# Patient Record
Sex: Male | Born: 1970 | Race: White | Hispanic: No | Smoking: Never smoker
Health system: Southern US, Community
[De-identification: ages and names within clinical notes are randomized; demographics above are authoritative.]

## PROBLEM LIST (undated history)

## (undated) DIAGNOSIS — I2510 Atherosclerotic heart disease of native coronary artery without angina pectoris: Secondary | ICD-10-CM

## (undated) DIAGNOSIS — I10 Essential (primary) hypertension: Secondary | ICD-10-CM

## (undated) DIAGNOSIS — I219 Acute myocardial infarction, unspecified: Secondary | ICD-10-CM

## (undated) DIAGNOSIS — R51 Headache: Secondary | ICD-10-CM

## (undated) DIAGNOSIS — G4730 Sleep apnea, unspecified: Secondary | ICD-10-CM

## (undated) HISTORY — DX: Atherosclerotic heart disease of native coronary artery without angina pectoris: I25.10

## (undated) HISTORY — PX: CORONARY STENT PLACEMENT: SHX1402

## (undated) HISTORY — DX: Essential (primary) hypertension: I10

---

## 2009-04-15 ENCOUNTER — Encounter: Admission: RE | Admit: 2009-04-15 | Discharge: 2009-04-15 | Payer: Self-pay | Admitting: Internal Medicine

## 2009-05-27 ENCOUNTER — Encounter: Admission: RE | Admit: 2009-05-27 | Discharge: 2009-05-27 | Payer: Self-pay | Admitting: Neurosurgery

## 2009-06-01 ENCOUNTER — Encounter: Admission: RE | Admit: 2009-06-01 | Discharge: 2009-06-01 | Payer: Self-pay | Admitting: Neurosurgery

## 2012-03-09 ENCOUNTER — Inpatient Hospital Stay (HOSPITAL_COMMUNITY)
Admission: EM | Admit: 2012-03-09 | Discharge: 2012-03-11 | DRG: 853 | Disposition: A | Discharge status: Home / Self Care | Payer: BC Managed Care – PPO | Attending: Cardiology | Admitting: Cardiology

## 2012-03-09 ENCOUNTER — Inpatient Hospital Stay (HOSPITAL_COMMUNITY): Admission: AD | Admit: 2012-03-09 | Payer: BC Managed Care – PPO | Source: Ambulatory Visit | Admitting: Cardiology

## 2012-03-09 ENCOUNTER — Emergency Department (HOSPITAL_COMMUNITY): Payer: BC Managed Care – PPO

## 2012-03-09 ENCOUNTER — Encounter (HOSPITAL_COMMUNITY)
Admission: EM | Disposition: A | Discharge status: Home / Self Care | Payer: Self-pay | Source: Home / Self Care | Attending: Cardiology

## 2012-03-09 ENCOUNTER — Encounter (HOSPITAL_COMMUNITY): Payer: Self-pay | Admitting: Emergency Medicine

## 2012-03-09 DIAGNOSIS — R079 Chest pain, unspecified: Secondary | ICD-10-CM

## 2012-03-09 DIAGNOSIS — I2582 Chronic total occlusion of coronary artery: Secondary | ICD-10-CM | POA: Diagnosis present

## 2012-03-09 DIAGNOSIS — G4730 Sleep apnea, unspecified: Secondary | ICD-10-CM | POA: Insufficient documentation

## 2012-03-09 DIAGNOSIS — E782 Mixed hyperlipidemia: Secondary | ICD-10-CM | POA: Diagnosis present

## 2012-03-09 DIAGNOSIS — Z955 Presence of coronary angioplasty implant and graft: Secondary | ICD-10-CM

## 2012-03-09 DIAGNOSIS — I2510 Atherosclerotic heart disease of native coronary artery without angina pectoris: Secondary | ICD-10-CM | POA: Diagnosis present

## 2012-03-09 DIAGNOSIS — I214 Non-ST elevation (NSTEMI) myocardial infarction: Principal | ICD-10-CM

## 2012-03-09 DIAGNOSIS — Z79899 Other long term (current) drug therapy: Secondary | ICD-10-CM

## 2012-03-09 DIAGNOSIS — Z7982 Long term (current) use of aspirin: Secondary | ICD-10-CM

## 2012-03-09 DIAGNOSIS — Z9861 Coronary angioplasty status: Secondary | ICD-10-CM

## 2012-03-09 DIAGNOSIS — I219 Acute myocardial infarction, unspecified: Secondary | ICD-10-CM

## 2012-03-09 HISTORY — DX: Sleep apnea, unspecified: G47.30

## 2012-03-09 HISTORY — DX: Headache: R51

## 2012-03-09 HISTORY — PX: LEFT HEART CATH: SHX5478

## 2012-03-09 HISTORY — PX: PERCUTANEOUS STENT INTERVENTION: SHX5500

## 2012-03-09 LAB — D-DIMER, QUANTITATIVE: D-Dimer, Quant: <0.27 ug/mL-FEU (ref 0.00–0.48)

## 2012-03-09 LAB — COMPREHENSIVE METABOLIC PANEL
ALT: 36 U/L (ref 0–53)
BUN: 18 mg/dL (ref 6–23)
CO2: 26 mEq/L (ref 19–32)
Calcium: 9.4 mg/dL (ref 8.4–10.5)
GFR calc Af Amer: >90 mL/min (ref >90)
GFR calc non Af Amer: >90 mL/min (ref >90)
Glucose, Bld: 119 mg/dL — ABNORMAL HIGH (ref 70–99)
Sodium: 138 mEq/L (ref 135–145)
Total Protein: 7.4 g/dL (ref 6.0–8.3)

## 2012-03-09 LAB — CBC WITH DIFFERENTIAL/PLATELET
Basophils Absolute: 0 10*3/uL (ref 0.0–0.1)
Basophils Relative: 0 % (ref 0–1)
MCHC: 34.7 g/dL (ref 30.0–36.0)
Neutro Abs: 3.9 10*3/uL (ref 1.7–7.7)
Neutrophils Relative %: 51 % (ref 43–77)
Platelets: 212 10*3/uL (ref 150–400)
RDW: 13.2 % (ref 11.5–15.5)

## 2012-03-09 LAB — CK TOTAL AND CKMB (NOT AT ARMC)
CK, MB: 16 ng/mL (ref 0.3–4.0)
Relative Index: 5.2 — ABNORMAL HIGH (ref 0.0–2.5)

## 2012-03-09 LAB — LIPID PANEL
HDL: 36 mg/dL — ABNORMAL LOW (ref >39)
Total CHOL/HDL Ratio: 4.8 RATIO
Triglycerides: 114 mg/dL (ref <150)

## 2012-03-09 LAB — TROPONIN I: Troponin I: >20 ng/mL (ref <0.30)

## 2012-03-09 LAB — HEMOGLOBIN A1C: Mean Plasma Glucose: 103 mg/dL (ref <117)

## 2012-03-09 LAB — POCT I-STAT TROPONIN I: Troponin i, poc: 0.05 ng/mL (ref 0.00–0.08)

## 2012-03-09 SURGERY — LEFT HEART CATH
Anesthesia: LOCAL

## 2012-03-09 MED ORDER — ASPIRIN 300 MG RE SUPP
300.0000 mg | RECTAL | Status: DC
  Filled 2012-03-09: qty 1

## 2012-03-09 MED ORDER — VERAPAMIL HCL 2.5 MG/ML IV SOLN
INTRAVENOUS | Status: AC
  Filled 2012-03-09: qty 2

## 2012-03-09 MED ORDER — SODIUM CHLORIDE 0.9 % IV BOLUS (SEPSIS)
1000.0000 mL | Freq: Once | INTRAVENOUS | Status: AC
  Administered 2012-03-09: 1000 mL via INTRAVENOUS

## 2012-03-09 MED ORDER — SODIUM CHLORIDE 0.9 % IV SOLN: 250.0000 mL | INTRAVENOUS | Status: DC | PRN

## 2012-03-09 MED ORDER — ONDANSETRON HCL 4 MG/2ML IJ SOLN
INTRAMUSCULAR | Status: AC
  Filled 2012-03-09: qty 2

## 2012-03-09 MED ORDER — LIDOCAINE HCL (PF) 1 % IJ SOLN
INTRAMUSCULAR | Status: AC
  Filled 2012-03-09: qty 30

## 2012-03-09 MED ORDER — SODIUM CHLORIDE 0.9 % IV SOLN: INTRAVENOUS | Status: DC

## 2012-03-09 MED ORDER — PRASUGREL HCL 10 MG PO TABS
10.0000 mg | ORAL_TABLET | Freq: Every day | ORAL | Status: DC
  Administered 2012-03-09 – 2012-03-11 (×3): 10 mg via ORAL
  Filled 2012-03-09 (×3): qty 1

## 2012-03-09 MED ORDER — ONDANSETRON HCL 4 MG/2ML IJ SOLN
4.0000 mg | Freq: Once | INTRAMUSCULAR | Status: AC
  Administered 2012-03-09: 4 mg via INTRAVENOUS

## 2012-03-09 MED ORDER — ONDANSETRON HCL 4 MG/2ML IJ SOLN: 4.0000 mg | Freq: Four times a day (QID) | INTRAMUSCULAR | Status: DC | PRN

## 2012-03-09 MED ORDER — SODIUM CHLORIDE 0.9 % IV SOLN
INTRAVENOUS | Status: AC
  Administered 2012-03-09: 150 mL via INTRAVENOUS

## 2012-03-09 MED ORDER — NITROGLYCERIN 0.4 MG SL SUBL: 0.4000 mg | SUBLINGUAL_TABLET | SUBLINGUAL | Status: DC | PRN

## 2012-03-09 MED ORDER — NITROGLYCERIN 0.2 MG/ML ON CALL CATH LAB
INTRAVENOUS | Status: AC
  Filled 2012-03-09: qty 1

## 2012-03-09 MED ORDER — MIDAZOLAM HCL 2 MG/2ML IJ SOLN
INTRAMUSCULAR | Status: AC
  Filled 2012-03-09: qty 2

## 2012-03-09 MED ORDER — HYDROMORPHONE HCL PF 2 MG/ML IJ SOLN
INTRAMUSCULAR | Status: AC
  Filled 2012-03-09: qty 1

## 2012-03-09 MED ORDER — ENOXAPARIN SODIUM 60 MG/0.6ML ~~LOC~~ SOLN
90.0000 mg | Freq: Once | SUBCUTANEOUS | Status: AC
  Administered 2012-03-09: 90 mg via SUBCUTANEOUS
  Filled 2012-03-09: qty 0.3

## 2012-03-09 MED ORDER — MORPHINE SULFATE 4 MG/ML IJ SOLN
4.0000 mg | Freq: Once | INTRAMUSCULAR | Status: AC
  Administered 2012-03-09: 4 mg via INTRAVENOUS
  Filled 2012-03-09: qty 1

## 2012-03-09 MED ORDER — DIAZEPAM 5 MG PO TABS: 5.0000 mg | ORAL_TABLET | ORAL | Status: DC

## 2012-03-09 MED ORDER — ATORVASTATIN CALCIUM 80 MG PO TABS: 80.0000 mg | ORAL_TABLET | Freq: Every day | ORAL | Status: DC

## 2012-03-09 MED ORDER — ATORVASTATIN CALCIUM 80 MG PO TABS
80.0000 mg | ORAL_TABLET | Freq: Every day | ORAL | Status: DC
  Filled 2012-03-09: qty 1

## 2012-03-09 MED ORDER — BIVALIRUDIN 250 MG IV SOLR
INTRAVENOUS | Status: AC
  Filled 2012-03-09: qty 250

## 2012-03-09 MED ORDER — SODIUM CHLORIDE 0.9 % IJ SOLN: 3.0000 mL | INTRAMUSCULAR | Status: DC | PRN

## 2012-03-09 MED ORDER — ASPIRIN 81 MG PO CHEW
81.0000 mg | CHEWABLE_TABLET | Freq: Every day | ORAL | Status: DC
  Administered 2012-03-10 – 2012-03-11 (×2): 81 mg via ORAL
  Filled 2012-03-09 (×2): qty 1

## 2012-03-09 MED ORDER — HEPARIN (PORCINE) IN NACL 2-0.9 UNIT/ML-% IJ SOLN
INTRAMUSCULAR | Status: AC
  Filled 2012-03-09: qty 1500

## 2012-03-09 MED ORDER — CARVEDILOL 3.125 MG PO TABS
3.1250 mg | ORAL_TABLET | Freq: Two times a day (BID) | ORAL | Status: DC
  Administered 2012-03-09 – 2012-03-11 (×4): 3.125 mg via ORAL
  Filled 2012-03-09 (×6): qty 1

## 2012-03-09 MED ORDER — ACETAMINOPHEN 325 MG PO TABS: 650.0000 mg | ORAL_TABLET | ORAL | Status: DC | PRN

## 2012-03-09 MED ORDER — ACETAMINOPHEN 325 MG PO TABS
650.0000 mg | ORAL_TABLET | ORAL | Status: DC | PRN
  Administered 2012-03-09 – 2012-03-10 (×2): 650 mg via ORAL
  Filled 2012-03-09 (×2): qty 2

## 2012-03-09 MED ORDER — GI COCKTAIL ~~LOC~~
30.0000 mL | Freq: Once | ORAL | Status: AC
  Administered 2012-03-09: 30 mL via ORAL
  Filled 2012-03-09: qty 30

## 2012-03-09 MED ORDER — PRASUGREL HCL 10 MG PO TABS
ORAL_TABLET | ORAL | Status: AC
  Filled 2012-03-09: qty 6

## 2012-03-09 MED ORDER — NITROGLYCERIN 0.4 MG SL SUBL
0.4000 mg | SUBLINGUAL_TABLET | SUBLINGUAL | Status: DC | PRN
  Administered 2012-03-09: 0.4 mg via SUBLINGUAL
  Filled 2012-03-09: qty 25

## 2012-03-09 NOTE — ED Notes (Signed)
Patient complaining of mid-sternal chest pain that started upon waking this morning.  Rates pain 7/10; mid-sternal.  Denies radiation of pain and associated cardiac symptoms.  Reports excessive burping; denies cardiac history.

## 2012-03-09 NOTE — CV Procedure (Signed)
Procedure performed:  Left heart catheterization including hemodynamic monitoring of the left ventricle, LV gram. Selective right and left coronary arteriography. Thrombectomy with use of Pronto V4 aspiration catheter. PTCA and stenting of the proximal ramus intermediate with implantation of a 3.0 x 20 mm Promos Premier eluting stent deployed at 11 atmospheric pressure. Stenosis reduced from 100% to 0%..  Indication: Patient is a  41 year-old  Caucasian  male with history of    Mixed hyperlipidemia, but a lipid profile testing done a year ago, has lost about 20 pounds in weight since I last saw him a year ago, presented to the emergency department with chest pain and his cardiac markers were positive for myocardial injury. Due to 2 episodes of ventricle standstill lasting for seconds, positive cardiac markers urgent cardiac catheterization was felt to be indicated. Hence is brought to the cardiac catheterization lab to evaluate  coronary anatomy for definitive diagnosis of CAD.  Hemodynamic data: Left ventricular pressure was 115/11 with LVEDP of 19 mm mercury. Aortic pressure was 117/75 with a mean of 96 mm mercury. There was no pressure gradient across the aortic valve.   Left ventricle: Performed in the RAO projection revealed LVEF of 55-60%.  there was mild anterolateral hypokinesis in the LAO view. There was  No  MR.   Right coronary artery: The vessel  large with mild diffuse luminal irregularity in the mid to distal segment followed by is to 70-80% hazy stenosis. It is codominant with circumflex coronary artery. The PDA is a very large branch. There is several by mouth small branches.  Left main coronary artery is large and normal.  Ramus intermediate: Large vessel which is occluded in the proximal segment. Distally it fills faintly. There was appearance of thrombus burden on the initial angiogram with evidence of staining with the dye.  Circumflex coronary artery: A large vessel giving  origin to a large obtuse marginal 1. It is codominant with right coronary artery. Gives origin to a small PDA.  LAD:  LAD gives origin to a large diagonal-1.  LAD has  minimal luminal irregularities.   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 Left systolic function is preserved with mild anterolateral hypokinesis. Has a 70-80% hazy distal RCA stenosis which also appears to be significant however probably not the culprit for the presentation at this time.   Interventional data: Successful  thrombectomy with use of Pronto V4 aspiration catheter followed by PTCA and stenting of the proximal ramus intermediate with implantation of a 3.0 x 20 mm Promos Premier eluting stent. Stenosis was reduced from 100% TIMI 0 flow to 0% with TIMI 3 flow.   Recommendation: Will need Dual antiplatelet therapy with Effient and ASA 81 mg for at least 1 year. I will consider bringing him back to relook right coronary artery and possibly consider either angioplasty or FFR. If he remains stable I may also consider outpatient stress testing. He'll need aggressive risk factor modification.    Technique of diagnostic cardiac catheterization:  Under sterile precautions using a 6 French right radial  arterial access, a 6 French sheath was introduced into the right radial artery. A 5 [place] Tig 4 catheter was advanced into the ascending aorta selective  right coronary artery and left coronary artery was cannulated and angiography was performed in multiple views. The catheter was pulled back Out of the body over exchange length J-wire.   same Catheter was used to perform LV gram which  was performed in LAO/RAO  projection. Catheter exchanged out of the body over J-Wire. NO immediate complications noted. Hemostasis achieved with TR band. Patient tolerated the procedure well.   Technique of intervention:  Using a 6 [place] XB 3.5 guide catheter the LM  coronary  was  selected and cannulated. Using Angiomax for anticoagulation, I utilized a BMW guidewire and across the Ramus intermediate coronary artery without any difficulty. I placed the tip of the wire into the distal  coronary artery. Angiography was performed.   Then I utilized a Pronto V4 aspiration catheter and one pass was made with aspiration of significant amount of white thrombus.  I proceeded with implantation of a  3.0 x 20  mm promos Premier   drug-eluting stent into the  ramus intermediate  coronary artery. The stent was deployed at  11  atmospheric pressure for  40   seconds.  Post angioplasty results were excellent with 0% residual stenoses and TIMI-3 flow was maintained. There was no evidence of edge dissection. The guidewire was withdrawn out of the body and the guide catheter was engaged and pulled out of the body over the J-wire the was no immediate complication. Patient tolerated the procedure well.  Disposition: Patient will be discharged in  24-48 hours  unless complications with out-patient follow up.

## 2012-03-09 NOTE — ED Provider Notes (Signed)
History     CSN: [id number]  Arrival date & time 03/09/12  0620   First MD Initiated Contact with Patient 03/09/12 0827      Chief Complaint  Patient presents with  . Chest Pain    (Consider location/radiation/quality/duration/timing/severity/associated sxs/prior treatment) Patient is a 41 y.o. male presenting with chest pain. The history is provided by the patient. No language interpreter was used.  Chest Pain The chest pain began 3 - 5 hours ago. Chest pain occurs constantly. The chest pain is unchanged. At its most intense, the pain is at 8/10. The pain is currently at 3/10. The severity of the pain is moderate. The quality of the pain is described as dull and sharp. The pain radiates to the upper back. Primary symptoms include nausea. Pertinent negatives for primary symptoms include no fever, no fatigue, no syncope, no shortness of breath, no cough, no wheezing, no palpitations, no vomiting and no dizziness.  Associated symptoms include diaphoresis.  Pertinent negatives for associated symptoms include no lower extremity edema, no near-syncope and no weakness.    41 year old male who awoke at 5 AM and was going to the bathroom and noticed he had some midsternal sharp chest pain with burping that radiated to his back and 8/10. The pain soon got better and it was a dull 3/10. Patient had no shortness of breath no vomiting. He did become diaphoretic and dizzy at that time which has resolved now. He took aspirin at home. Had a negative stress test one year ago. He does not have high cholesterol or family history of heart disease and he does not smoke. He does travel frequently. Average 10 a week flying. Denies shortness of breath, or calf pain.   Patient states that he had fried chicken last night and while. Her dinner. States the pain is still constant 3/10 but it is dull and it does not radiate to his back this time. Patient's primary care provider is Dr. Darnelle Catalan. Patient has seen Dr. Jacinto Halim  in  the past  with [facility] medical  Past Medical History  Diagnosis Date  . Sleep apnea     History reviewed. No pertinent past surgical history.  History reviewed. No pertinent family history.  History  Substance Use Topics  . Smoking status: Never Smoker   . Smokeless tobacco: Not on file  . Alcohol Use: Yes      Review of Systems  Constitutional: Positive for diaphoresis. Negative for fever and fatigue.  HENT: Negative.   Eyes: Negative.   Respiratory: Negative.  Negative for cough, shortness of breath and wheezing.   Cardiovascular: Positive for chest pain. Negative for palpitations, syncope and near-syncope.  Gastrointestinal: Positive for nausea. Negative for vomiting.  Neurological: Negative.  Negative for dizziness and weakness.  Psychiatric/Behavioral: Negative.   All other systems reviewed and are negative.    Allergies  Review of patient's allergies indicates no known allergies.  Home Medications   Current Outpatient Rx  Name  Route  Sig  Dispense  Refill  . ASPIRIN 325 MG PO TABS   Oral   Take 325 mg by mouth once.           BP 124/84  Pulse 59  Temp 98.1 F (36.7 C) (Oral)  Resp 20  SpO2 94%  Physical Exam  Nursing note and vitals reviewed. Constitutional: He is oriented to person, place, and time. He appears well-developed and well-nourished.  HENT:  Head: Normocephalic.  Eyes: Conjunctivae normal and EOM are normal. Pupils are  equal, round, and reactive to light.  Neck: Normal range of motion. Neck supple.  Cardiovascular: Normal rate.   Pulmonary/Chest: Effort normal and breath sounds normal. No respiratory distress.  Abdominal: Soft. Bowel sounds are normal. He exhibits no distension.  Musculoskeletal: Normal range of motion.  Neurological: He is alert and oriented to person, place, and time.  Skin: Skin is warm and dry.  Psychiatric: He has a normal mood and affect.    ED Course  Procedures (including critical care time)  Labs  Reviewed  COMPREHENSIVE METABOLIC PANEL - Abnormal; Notable for the following:    Glucose, Bld 119 (*)     All other components within normal limits  CBC WITH DIFFERENTIAL  POCT I-STAT TROPONIN I   Dg Chest 2 View  03/09/2012  *RADIOLOGY REPORT*  Clinical Data: Chest pain.  Nausea, syncope.  CHEST - 2 VIEW  Comparison: 10/28/2010  Findings: Heart and mediastinal contours are within normal limits. No focal opacities or effusions.  No acute bony abnormality.  IMPRESSION: No active cardiopulmonary disease.   Original Report Authenticated By: Charlett Nose, M.D.      No diagnosis found.    MDM  10am  Patient will be admitted for Chest pain per Dr. Nadara Eaton who will see him in the ER.  Second troponin shows and elevation.  D dimer negative. CP 3/10 dull ache.  Will give morphine to see if pain goes away.  No change in pain with ntg.  Some better after GI cocktail.  Chest x-ray unremarkable reviewed by myself.  Shared visit with Dr. Ignacia Palma.     Labs Reviewed  COMPREHENSIVE METABOLIC PANEL - Abnormal; Notable for the following:    Glucose, Bld 119 (*)     All other components within normal limits  POCT I-STAT TROPONIN I - Abnormal; Notable for the following:    Troponin i, poc 0.17 (*)     All other components within normal limits  CK TOTAL AND CKMB - Abnormal; Notable for the following:    Total CK 308 (*)     CK, MB 16.0 (*)     Relative Index 5.2 (*)     All other components within normal limits  TROPONIN I - Abnormal; Notable for the following:    Troponin I 1.08 (*)     All other components within normal limits  CBC WITH DIFFERENTIAL  POCT I-STAT TROPONIN I  D-DIMER, QUANTITATIVE    11:55  Patient was given morphine for his 3/10 chest pressure.  Patient became diaphoretic and pre syncopal with a few seconds of asytole.  Patient 's heart rate then went into the 30's nsr.  Patient denies chest pain at this time but he did become diaphoretic and very pale. Pacer pads on.  O2 2L [place]  applied.  Dr. Nadara Eaton at bedside.  Patient for cath lab now.          Remi Haggard, NP 03/09/12 1210

## 2012-03-09 NOTE — ED Notes (Signed)
Pt to Cath lab for cath. Md at bedside the patient A/O at time of transport

## 2012-03-09 NOTE — ED Provider Notes (Signed)
8:31 AM  Date: 03/09/2012  Rate: 52  Rhythm: sinus bradycardia  QRS Axis: normal  Intervals: normal PQRS:  Left atrial abnormality.  ST/T Wave abnormalities: normal  Conduction Disutrbances:none  Narrative Interpretation: Borderline EKG  Old EKG Reviewed: none available    Carleene Cooper III, MD 03/09/12 [phone]

## 2012-03-09 NOTE — ED Notes (Signed)
Pt states last meal was chicken and waffles at around 2000 last night. States he has had this pain before but not this bad.

## 2012-03-09 NOTE — ED Notes (Signed)
Abnormal labs where given to Dr. Ignacia Palma

## 2012-03-09 NOTE — H&P (Signed)
Justin Keller is an 41 y.o. male.   Chief Complaint: Chest pain HPI: Patient is a 41 year old Caucasian male with no prior history of cardiac disease, no history of hypertension but does have mild hyperlipidemia who had been doing well and I had evaluated him for atypical chest pain in May of 2012 and he has had  routine treadmill exercise stress test on 10/04/2010, in which was able to exercise for 9 minutes without any chest pain in the EKG was negative for ischemia. He had been doing well until yesterday afternoon around 3:00 in the afternoon, started to feel unwell but could not really point his fingers to anything, he had a lot of belching. Last night he had mild chest discomfort and he woke up around 4:00 this morning with mild chest discomfort which again significant amount of belching. However the chest discomfort got worse around 5:30 to 6:00 in the morning and eventually he presented to the emergency room. Due to his presentation, cardiac markers were obtained which were positive for non-ST elevation myocardial infarction. I was called to see the patient. Patient had 3 episodes of near-syncope and probably one episode of syncope after the IV line was started. At home he felt near syncopal at one point. He received IV morphine for chest discomfort, then he had 4 second ventricle standstill. He still continues to have mild chest discomfort. He denies any hemoptysis, denies any leg edema, denies any painful swelling of the legs. He has been traveling recently a lot of his work.  Past Medical History  Diagnosis Date  . Sleep apnea     History reviewed. No pertinent past surgical history.  History reviewed. No pertinent family history. Social History:  reports that he has never smoked. He does not have any smokeless tobacco history on file. He reports that he drinks alcohol. He reports that he does not use illicit drugs.  Allergies: No Known Allergies  Review of Systems - General ROS:  negative for - chills, fatigue, fever, hot flashes, malaise, night sweats, weight gain or weight loss Respiratory ROS: no cough, shortness of breath, or wheezing Cardiovascular ROS: positive for - chest pain negative for - dyspnea on exertion, edema, murmur, orthopnea, palpitations or paroxysmal nocturnal dyspnea Neurological ROS: no TIA or stroke symptoms  Blood pressure 130/76, pulse 63, temperature 98.1 F (36.7 C), temperature source Oral, resp. rate 15, SpO2 98.00%. General appearance: alert, appears stated age and no distress Eyes: conjunctivae/corneas clear. PERRL, EOM's intact. Fundi benign. Neck: no adenopathy, no carotid bruit, no JVD, supple, symmetrical, trachea midline and thyroid not enlarged, symmetric, no tenderness/mass/nodules Neck: JVP - normal, carotids 2+= without bruits Resp: clear to auscultation bilaterally Chest wall: no tenderness Cardio: regular rate and rhythm, S1, S2 normal, no murmur, click, rub or gallop GI: soft, non-tender; bowel sounds normal; no masses,  no organomegaly Extremities: extremities normal, atraumatic, no cyanosis or edema Pulses: 2+ and symmetric Skin: Skin color, texture, turgor normal. No rashes or lesions Neurologic: Grossly normal  Results for orders placed during the hospital encounter of 03/09/12 (from the past 48 hour(s))  CBC WITH DIFFERENTIAL     Status: Normal   Collection Time   03/09/12  6:42 AM      Component Value Range Comment   WBC 7.6  4.0 - 10.5 K/uL    RBC 4.61  4.22 - 5.81 MIL/uL    Hemoglobin 14.9  13.0 - 17.0 g/dL    HCT 16.1  09.6 - 04.5 %  MCV 93.3  78.0 - 100.0 fL    MCH 32.3  26.0 - 34.0 pg    MCHC 34.7  30.0 - 36.0 g/dL    RDW 16.1  09.6 - 04.5 %    Platelets 212  150 - 400 K/uL    Neutrophils Relative 51  43 - 77 %    Neutro Abs 3.9  1.7 - 7.7 K/uL    Lymphocytes Relative 39  12 - 46 %    Lymphs Abs 2.9  0.7 - 4.0 K/uL    Monocytes Relative 8  3 - 12 %    Monocytes Absolute 0.6  0.1 - 1.0 K/uL     Eosinophils Relative 3  0 - 5 %    Eosinophils Absolute 0.2  0.0 - 0.7 K/uL    Basophils Relative 0  0 - 1 %    Basophils Absolute 0.0  0.0 - 0.1 K/uL   COMPREHENSIVE METABOLIC PANEL     Status: Abnormal   Collection Time   03/09/12  6:42 AM      Component Value Range Comment   Sodium 138  135 - 145 mEq/L    Potassium 4.0  3.5 - 5.1 mEq/L    Chloride 102  96 - 112 mEq/L    CO2 26  19 - 32 mEq/L    Glucose, Bld 119 (*) 70 - 99 mg/dL    BUN 18  6 - 23 mg/dL    Creatinine, Ser 4.09  0.50 - 1.35 mg/dL    Calcium 9.4  8.4 - 81.1 mg/dL    Total Protein 7.4  6.0 - 8.3 g/dL    Albumin 3.9  3.5 - 5.2 g/dL    AST 29  0 - 37 U/L    ALT 36  0 - 53 U/L    Alkaline Phosphatase 60  39 - 117 U/L    Total Bilirubin 0.4  0.3 - 1.2 mg/dL    GFR calc non Af Amer >90  >90 mL/min    GFR calc Af Amer >90  >90 mL/min   POCT I-STAT TROPONIN I     Status: Normal   Collection Time   03/09/12  6:47 AM      Component Value Range Comment   Troponin i, poc 0.05  0.00 - 0.08 ng/mL    Comment 3            D-DIMER, QUANTITATIVE     Status: Normal   Collection Time   03/09/12  9:24 AM      Component Value Range Comment   D-Dimer, Quant <0.27  0.00 - 0.48 ug/mL-FEU   POCT I-STAT TROPONIN I     Status: Abnormal   Collection Time   03/09/12  9:47 AM      Component Value Range Comment   Troponin i, poc 0.17 (*) 0.00 - 0.08 ng/mL    Comment NOTIFIED PHYSICIAN      Comment 3            CK TOTAL AND CKMB     Status: Abnormal   Collection Time   03/09/12 10:43 AM      Component Value Range Comment   Total CK 308 (*) 7 - 232 U/L    CK, MB 16.0 (*) 0.3 - 4.0 ng/mL    Relative Index 5.2 (*) 0.0 - 2.5   TROPONIN I     Status: Abnormal   Collection Time   03/09/12 10:43 AM      Component  Value Range Comment   Troponin I 1.08 (*) <0.30 ng/mL    Dg Chest 2 View  03/09/2012  *RADIOLOGY REPORT*  Clinical Data: Chest pain.  Nausea, syncope.  CHEST - 2 VIEW  Comparison: 10/28/2010  Findings: Heart and mediastinal  contours are within normal limits. No focal opacities or effusions.  No acute bony abnormality.  IMPRESSION: No active cardiopulmonary disease.   Original Report Authenticated By: Charlett Nose, M.D.     Labs:   Lab Results  Component Value Date   WBC 7.6 03/09/2012   HGB 14.9 03/09/2012   HCT 43.0 03/09/2012   MCV 93.3 03/09/2012   PLT 212 03/09/2012    Lab 03/09/12 0642  NA 138  K 4.0  CL 102  CO2 26  BUN 18  CREATININE 0.94  CALCIUM 9.4  PROT 7.4  BILITOT 0.4  ALKPHOS 60  ALT 36  AST 29  GLUCOSE 119*   Lab Results  Component Value Date   CKTOTAL 308* 03/09/2012   CKMB 16.0* 03/09/2012   TROPONINI 1.08* 03/09/2012    EKG: 03/09/2012: normal EKG, normal sinus rhythm at a rate of 52 beats a minute, normal intervals, no evidence of ischemia.   Assessment/Plan 1. Non-ST elevation myocardial infarction with EKG revealing no testicular change ischemia but sinus bradycardia otherwise normal. 2. Syncope and ventricular standstill when patient was started on IV line, he had one episode of syncope and was transient but also had another episode of ventricular  standstill and near syncope when he was administered 3 mg of morphine sulfate intravenously. 3. Mixed hyperlipidemia: Labs done on 04/05/2010 had revealed total cholesterol of 293, triglycerides 153, HDL 30, LDL was 122.  Recommendation: I'm concerned about his presentation and increasing cardiac markers for myocardial injury. I suspect he probably may have right coronary artery or a dominant circumflex coronary artery stenosis. I will proceed with cardiac catheterization. Patient understands to less than 1% risk of that, stroke, MI, need for urgent CABG but not limited to these as major complications from cardiac catheterization. The patient's mother is at the bedside. Patient has already received Lovenox and aspirin on presentation. I also perform lipid profile testing as he has not eaten anything since morning. Further conditions to  follow after cardiac catheterization.   Pamella Pert, MD 03/09/2012, 12:15 PM [facility] Pager: [phone] Office: [phone] If no answer: Cell:  [phone]

## 2012-03-09 NOTE — Progress Notes (Signed)
Chaplain responded to page from ED that pt would be going from A4 to cath lab. He is a non-STEMI patient. Visited with pt's mom in hall outside pt room as she waited for cath lab team to be assembled. She said pt is under great stress from his work and also has recently experienced a marital separation. At [facility] request and pt's consent I led prayer at bedside with pt and mom. I had to depart before pt went to cath lab. Later I went to consult room outside cath lab and was present with mom and pt's wife when Dr. Jacinto Halim reported to them. Procedure went very well and pt has been moved to 2900. I took pt's mom and wife to 2900 waiting area where nurse met them and took them to pt's room - 2923.

## 2012-03-09 NOTE — Progress Notes (Signed)
12:04 PM Pt had brief sinus arrest lasting several seconds.  Third TNI was 1.08, going up.  Dr. Jacinto Halim taking pt to cath lab.  CRITICAL CARE Performed by: Osvaldo Human   Total critical care time: 30 Critical care time was exclusive of separately billable procedures and treating other patients.  Critical care was necessary to treat or prevent imminent or life-threatening deterioration.  Critical care was time spent personally by me on the following activities: development of treatment plan with patient and/or surrogate as well as nursing, discussions with consultants, evaluation of patient's response to treatment, examination of patient, obtaining history from patient or surrogate, ordering and performing treatments and interventions, ordering and review of laboratory studies, ordering and review of radiographic studies, pulse oximetry and  re-evaluation of patient's condition.

## 2012-03-09 NOTE — Progress Notes (Signed)
41 yo man with chest pain today.  EKG is non-acute, but second TNI is high at 0.17.  Call to Dr. Yates Decamp, cardiologist whom he has seen before --> 10:46 AM Spoke to Dr. Jacinto Halim, who requested a third set of cardiac markers, an injection of Lovenox, and he will be in to see pt.

## 2012-03-09 NOTE — ED Notes (Signed)
Thurston Hole, NP at the bedside.

## 2012-03-10 LAB — BASIC METABOLIC PANEL
Chloride: 105 mEq/L (ref 96–112)
Creatinine, Ser: 0.89 mg/dL (ref 0.50–1.35)
GFR calc Af Amer: >90 mL/min (ref >90)
GFR calc non Af Amer: >90 mL/min (ref >90)
Potassium: 3.6 mEq/L (ref 3.5–5.1)

## 2012-03-10 LAB — CK TOTAL AND CKMB (NOT AT ARMC): Total CK: 1221 U/L — ABNORMAL HIGH (ref 7–232)

## 2012-03-10 LAB — MRSA PCR SCREENING: MRSA by PCR: NEGATIVE

## 2012-03-10 LAB — TSH: TSH: 0.884 u[IU]/mL (ref 0.350–4.500)

## 2012-03-10 LAB — CBC
MCHC: 34.2 g/dL (ref 30.0–36.0)
MCV: 94.6 fL (ref 78.0–100.0)
Platelets: 182 10*3/uL (ref 150–400)
RDW: 13.7 % (ref 11.5–15.5)
WBC: 8.7 10*3/uL (ref 4.0–10.5)

## 2012-03-10 LAB — TROPONIN I: Troponin I: >20 ng/mL (ref <0.30)

## 2012-03-10 MED ORDER — HEART ATTACK BOUNCING BOOK
Freq: Once | Status: AC
  Administered 2012-03-10: 17:00:00
  Filled 2012-03-10: qty 1

## 2012-03-10 MED ORDER — THE SENSUOUS HEART BOOK
Freq: Once | Status: AC
  Administered 2012-03-10: 17:00:00
  Filled 2012-03-10: qty 1

## 2012-03-10 MED ORDER — EXERCISE FOR HEART AND HEALTH BOOK
Freq: Once | Status: AC
  Administered 2012-03-10: 17:00:00
  Filled 2012-03-10: qty 1

## 2012-03-10 MED ORDER — LIVING BETTER WITH HEART FAILURE BOOK
Freq: Once | Status: AC
  Administered 2012-03-10: 16:00:00
  Filled 2012-03-10: qty 1

## 2012-03-10 MED ORDER — ATORVASTATIN CALCIUM 80 MG PO TABS
80.0000 mg | ORAL_TABLET | Freq: Every day | ORAL | Status: DC
  Administered 2012-03-10: 80 mg via ORAL
  Filled 2012-03-10 (×2): qty 1

## 2012-03-10 MED ORDER — LISINOPRIL 2.5 MG PO TABS
2.5000 mg | ORAL_TABLET | Freq: Every day | ORAL | Status: DC
  Administered 2012-03-10 – 2012-03-11 (×2): 2.5 mg via ORAL
  Filled 2012-03-10 (×2): qty 1

## 2012-03-10 MED ORDER — ACTIVE PARTNERSHIP FOR HEALTH OF YOUR HEART BOOK
Freq: Once | Status: AC
  Administered 2012-03-10: 16:00:00
  Filled 2012-03-10: qty 1

## 2012-03-10 NOTE — Progress Notes (Signed)
Report was called to RN on unit [facility]. Pt will transfer to room 3 [address] 31.  Patient called family to inform of room change.

## 2012-03-10 NOTE — Progress Notes (Signed)
Subjective:  No further chest pain since antiplastic. States that he can remember the chest discomfort he had when he presented was a funny feeling in her chest and also some discomfort in the back. No further episodes. Feels well. Has been walking in the room and also to the bathroom without any discomfort.  Objective:  Vital Signs in the last 24 hours: Temp:  [98.5 F (36.9 C)-99.3 F (37.4 C)] 99.3 F (37.4 C) (12/08 0740) Pulse Rate:  [64-86] 86  (12/08 0746) Resp:  [12-18] 14  (12/08 0740) BP: (109-137)/(61-88) 115/62 mmHg (12/08 0746) SpO2:  [96 %-100 %] 96 % (12/08 0740)  Intake/Output from previous day: 12/07 0701 - 12/08 0700 In: 1650 [P.O.:600; I.V.:1050] Out: 2 [Urine:2]  Physical Exam:   General appearance: alert, cooperative, appears stated age and no distress Eyes: conjunctivae/corneas clear. PERRL, EOM's intact. Fundi benign. Neck: no adenopathy, no carotid bruit, no JVD, supple, symmetrical, trachea midline and thyroid not enlarged, symmetric, no tenderness/mass/nodules Neck: JVP - normal, carotids 2+= without bruits Resp: clear to auscultation bilaterally Chest wall: no tenderness Cardio: regular rate and rhythm, S1, S2 normal, no murmur, click, rub or gallop GI: soft, non-tender; bowel sounds normal; no masses,  no organomegaly Extremities: extremities normal, atraumatic, no cyanosis or edema and Right radial arterial access site without any hematoma with presence of good pulse.    Lab Results:  [facility] 03/10/12 0605 03/09/12 0642  WBC 8.7 7.6  HGB 13.3 14.9  PLT 182 212    Basename 03/10/12 0605 03/09/12 0642  NA 140 138  K 3.6 4.0  CL 105 102  CO2 24 26  GLUCOSE 99 119*  BUN 11 18  CREATININE 0.89 0.94    Basename 03/10/12 0605 03/09/12 2134  TROPONINI >20.00* >20.00*   Hepatic Function Panel  Basename 03/09/12 0642  PROT 7.4  ALBUMIN 3.9  AST 29  ALT 36  ALKPHOS 60  BILITOT 0.4  BILIDIR --  IBILI --    Basename 03/09/12 1315   CHOL 171   No results found for this basename: PROTIME in the last 72 hours Lipid Panel     Component Value Date/Time   CHOL 171 03/09/2012 1315   TRIG 114 03/09/2012 1315   HDL 36* 03/09/2012 1315   CHOLHDL 4.8 03/09/2012 1315   VLDL 23 03/09/2012 1315   LDLCALC 112* 03/09/2012 1315     Imaging: Imaging results have been reviewed  Cardiac Studies:  EKG: normal EKG, incomplete right bundle branch block normal sinus rhythm, unchanged from previous tracings.   Assessment/Plan:  1. Non-ST elevation myocardial infarction presently doing well. 2. Hyperlipidemia mild  Recommendation: Doing well from cardiac standpoint, troponin markedly elevated and will represent washout of total occlusion and revascularization myocardial injury pattern. I will add CPK totals. Transferred to telemetry and possible discharge in the morning. I may consider repeating cardiac catheterization in 2 weeks to reevaluate distal right coronary artery stenosis.   Pamella Pert, M.D. 03/10/2012, 11:39 AM [facility] Pager: [phone] Office: [phone] If no answer: [phone]

## 2012-03-10 NOTE — Plan of Care (Signed)
Problem: Phase II Progression Outcomes Goal: Vascular site scale level 0 - I Vascular Site Scale Level 0: No bruising/bleeding/hematoma Level I (Mild): Bruising/Ecchymosis, minimal bleeding/ooozing, palpable hematoma < 3 cm Level II (Moderate): Bleeding not affecting hemodynamic parameters, pseudoaneurysm, palpable hematoma > 3 cm Outcome: Completed/Met Date Met:  03/10/12 r radial level (0)

## 2012-03-11 DIAGNOSIS — Z9861 Coronary angioplasty status: Secondary | ICD-10-CM

## 2012-03-11 LAB — CK TOTAL AND CKMB (NOT AT ARMC): Total CK: 404 U/L — ABNORMAL HIGH (ref 7–232)

## 2012-03-11 MED ORDER — ATORVASTATIN CALCIUM 80 MG PO TABS: 80.0000 mg | ORAL_TABLET | Freq: Every day | ORAL | Status: DC

## 2012-03-11 MED ORDER — ASPIRIN 81 MG PO CHEW: 81.0000 mg | CHEWABLE_TABLET | Freq: Every day | ORAL | Status: DC

## 2012-03-11 MED ORDER — LISINOPRIL 2.5 MG PO TABS: 2.5000 mg | ORAL_TABLET | Freq: Every day | ORAL | Status: DC

## 2012-03-11 MED ORDER — CARVEDILOL 3.125 MG PO TABS: 3.1250 mg | ORAL_TABLET | Freq: Two times a day (BID) | ORAL | Status: DC

## 2012-03-11 MED ORDER — NITROGLYCERIN 0.4 MG SL SUBL: 0.4000 mg | SUBLINGUAL_TABLET | SUBLINGUAL | Status: DC | PRN

## 2012-03-11 MED ORDER — PRASUGREL HCL 10 MG PO TABS: 10.0000 mg | ORAL_TABLET | Freq: Every day | ORAL | Status: DC

## 2012-03-11 MED FILL — Dextrose Inj 5%: INTRAVENOUS | Qty: 50 | Status: AC

## 2012-03-11 NOTE — Care Management Note (Signed)
    Page 1 of 1   03/11/2012     11:39:03 AM   CARE MANAGEMENT NOTE 03/11/2012  Patient:  URHO, RIO   Account Number:  [id number]  Date Initiated:  03/11/2012  Documentation initiated by:  GRAVES-BIGELOW,Alishah Schulte  Subjective/Objective Assessment:   Pt admitted with NStemi- S/p cath. Plan for d/c home today on effint. CM did call [facility] and Henreitta Cea is available.     Action/Plan:   CM provided pt with a 30 day free card and co pay card.   Anticipated DC Date:  03/11/2012   Anticipated DC Plan:  HOME/SELF CARE      DC Planning Services  CM consult  Medication Assistance      Choice offered to / List presented to:             Status of service:  Completed, signed off Medicare Important Message given?   (If response is "NO", the following Medicare IM given date fields will be blank) Date Medicare IM given:   Date Additional Medicare IM given:    Discharge Disposition:  HOME/SELF CARE  Per UR Regulation:  Reviewed for med. necessity/level of care/duration of stay  If discussed at Long Length of Stay Meetings, dates discussed:    Comments:

## 2012-03-11 NOTE — Discharge Summary (Signed)
Physician Discharge Summary  Patient ID: Justin Keller MRN: [id number] DOB/AGE: June 20, 1970 41 y.o.  Admit date: 03/09/2012 Discharge date: 03/11/2012  Primary Discharge Diagnosis non-ST elevation myocardial infarction  Secondary Discharge Diagnosis  Hyperlipidemia  Significant Diagnostic Studies: Coronary angioplasty 10/21/2011 for acute non-ST elevation myocardial infarction. Successful Thrombectomy with use of Pronto V4 aspiration catheter. PTCA and stenting of the proximal ramus intermediate with implantation of a 3.0 x 20 mm Promos Premier eluting stent  Hospital Course:  patient presented to the hospital with mild chest discomfort and back pain that started around 5:30 in the morning. He also had presyncopal episodes. Do to positive cardiac markers and ongoing chest discomfort, she was urgently taken to the cardiac catheterization lab. His EKG was normal. He had complete occlusion of a large ramus intermediate branch. Underwent successful angioplasty. He has residual distal RCA stenosis which he will be brought back in couple weeks to relook at the ramus intermediate stent and also reevaluate the significance of distal RCA stenosis. He did well after antiplastic without any recurrence of chest discomfort. He was ablating in the hallway without any discomfort. Hence felt stable for discharge.   Recommendations on discharge:  no driving for one week, no return to work for 2 weeks. Patient advised not to stop aspirin and Effient until advised to do so.  Discharge Exam: Blood pressure 116/68, pulse 79, temperature 98.6 F (37 C), temperature source Oral, resp. rate 18, weight 80.015 kg (176 lb 6.4 oz), SpO2 96.00%.   General appearance: alert, cooperative, appears stated age and no distress  Eyes: conjunctivae/corneas clear. PERRL, EOM's intact. Fundi benign.  Neck: no adenopathy, no carotid bruit, no JVD, supple, symmetrical, trachea midline and thyroid not enlarged, symmetric, no  tenderness/mass/nodules  Neck: JVP - normal, carotids 2+= without bruits  Resp: clear to auscultation bilaterally  Chest wall: no tenderness  Cardio: regular rate and rhythm, S1, S2 normal, no murmur, click, rub or gallop  GI: soft, non-tender; bowel sounds normal; no masses, no organomegaly  Extremities: extremities normal, atraumatic, no cyanosis or edema and Right radial arterial access site without any hematoma with presence of good pulse.  Labs:   Lab Results  Component Value Date   WBC 8.7 03/10/2012   HGB 13.3 03/10/2012   HCT 38.9* 03/10/2012   MCV 94.6 03/10/2012   PLT 182 03/10/2012    Lab 03/10/12 0605 03/09/12 0642  NA 140 --  K 3.6 --  CL 105 --  CO2 24 --  BUN 11 --  CREATININE 0.89 --  CALCIUM 8.8 --  PROT -- 7.4  BILITOT -- 0.4  ALKPHOS -- 60  ALT -- 36  AST -- 29  GLUCOSE 99 --   Lab Results  Component Value Date   CKTOTAL 404* 03/11/2012   CKMB 11.6* 03/11/2012   TROPONINI >20.00* 03/10/2012    Lipid Panel     Component Value Date/Time   CHOL 171 03/09/2012 1315   TRIG 114 03/09/2012 1315   HDL 36* 03/09/2012 1315   CHOLHDL 4.8 03/09/2012 1315   VLDL 23 03/09/2012 1315   LDLCALC 112* 03/09/2012 1315    EKG: normal EKG, normal sinus rhythm, unchanged from previous tracings.    Radiology: Dg Chest 2 View  03/09/2012  *RADIOLOGY REPORT*  Clinical Data: Chest pain.  Nausea, syncope.  CHEST - 2 VIEW  Comparison: 10/28/2010  Findings: Heart and mediastinal contours are within normal limits. No focal opacities or effusions.  No acute bony abnormality.  IMPRESSION: No active cardiopulmonary  disease.   Original Report Authenticated By: Charlett Nose, M.D.       FOLLOW UP PLANS AND APPOINTMENTS    Medication List     As of 03/11/2012  9:17 AM    STOP taking these medications         aspirin 325 MG tablet      TAKE these medications         aspirin 81 MG chewable tablet   Chew 1 tablet (81 mg total) by mouth daily.      atorvastatin 80 MG tablet    Commonly known as: LIPITOR   Take 1 tablet (80 mg total) by mouth daily at 6 PM.      carvedilol 3.125 MG tablet   Commonly known as: COREG   Take 1 tablet (3.125 mg total) by mouth 2 (two) times daily with a meal.      lisinopril 2.5 MG tablet   Commonly known as: PRINIVIL,ZESTRIL   Take 1 tablet (2.5 mg total) by mouth daily.      nitroGLYCERIN 0.4 MG SL tablet   Commonly known as: NITROSTAT   Place 1 tablet (0.4 mg total) under the tongue every 5 (five) minutes x 3 doses as needed for chest pain.      prasugrel 10 MG Tabs   Commonly known as: EFFIENT   Take 1 tablet (10 mg total) by mouth daily.           Follow-up Information    Follow up with Pamella Pert, MD. On 03/21/2012. (2:15pm. Arrive 15 minutes previous.)    Contact information:   [address] [place] [address] [phone]           Pamella Pert, MD 03/11/2012, 9:17 AM  Pager: [phone] Office: [phone] If no answer: [phone]

## 2012-03-11 NOTE — Care Management Note (Signed)
UR Completed Drisana Schweickert Graves-Bigelow, RN,BSN [phone]  

## 2012-03-11 NOTE — Progress Notes (Signed)
CARDIAC REHAB PHASE I   PRE:  Rate/Rhythm: 79 SR    BP: sitting 116/68    SaO2:   MODE:  Ambulation: 850 ft   POST:  Rate/Rhythm: 86    BP: sitting 113/69     SaO2:   Tolerated well. Feels good. Ed completed with family present. Interested in [facility] and will send referral to [place] [facility].  [phone]  Justin Keller CES, ACSM

## 2012-03-12 NOTE — ED Provider Notes (Signed)
Medical screening examination/treatment/procedure(s) were conducted as a shared visit with non-physician practitioner(s) and myself.  I personally evaluated the patient during the encounter 12:04 PM  Pt had brief sinus arrest lasting several seconds. Third TNI was 1.08, going up. Dr. Jacinto Halim taking pt to cath lab.  CRITICAL CARE Performed by: Osvaldo Human  Total critical care time: 30  Critical care time was exclusive of separately billable procedures and treating other patients.  Critical care was necessary to treat or prevent imminent or life-threatening deterioration.  Critical care was time spent personally by me on the following activities: development of treatment plan with patient and/or surrogate as well as nursing, discussions with consultants, evaluation of patient's response to treatment, examination of patient, obtaining history from patient or surrogate, ordering and performing treatments and interventions, ordering and review of laboratory studies, ordering and review of radiographic studies, pulse oximetry and  re-evaluation of patient's condition.       Carleene Cooper III, MD 03/12/12 2101

## 2012-03-25 ENCOUNTER — Encounter (HOSPITAL_COMMUNITY): Payer: Self-pay | Admitting: Respiratory Therapy

## 2012-04-02 ENCOUNTER — Ambulatory Visit (HOSPITAL_COMMUNITY)
Admission: RE | Admit: 2012-04-02 | Discharge: 2012-04-02 | Disposition: A | Discharge status: Home / Self Care | Payer: BC Managed Care – PPO | Source: Ambulatory Visit | Attending: Cardiology | Admitting: Cardiology

## 2012-04-02 ENCOUNTER — Encounter (HOSPITAL_COMMUNITY)
Admission: RE | Disposition: A | Discharge status: Home / Self Care | Payer: Self-pay | Source: Ambulatory Visit | Attending: Cardiology

## 2012-04-02 ENCOUNTER — Encounter (HOSPITAL_COMMUNITY): Payer: Self-pay

## 2012-04-02 DIAGNOSIS — I214 Non-ST elevation (NSTEMI) myocardial infarction: Secondary | ICD-10-CM | POA: Insufficient documentation

## 2012-04-02 DIAGNOSIS — E785 Hyperlipidemia, unspecified: Secondary | ICD-10-CM | POA: Insufficient documentation

## 2012-04-02 DIAGNOSIS — Z9861 Coronary angioplasty status: Secondary | ICD-10-CM | POA: Insufficient documentation

## 2012-04-02 DIAGNOSIS — I2510 Atherosclerotic heart disease of native coronary artery without angina pectoris: Secondary | ICD-10-CM | POA: Insufficient documentation

## 2012-04-02 HISTORY — PX: LEFT HEART CATHETERIZATION WITH CORONARY ANGIOGRAM: SHX5451

## 2012-04-02 HISTORY — DX: Acute myocardial infarction, unspecified: I21.9

## 2012-04-02 SURGERY — LEFT HEART CATHETERIZATION WITH CORONARY ANGIOGRAM
Anesthesia: LOCAL | Laterality: Bilateral

## 2012-04-02 MED ORDER — ACETAMINOPHEN 325 MG PO TABS
ORAL_TABLET | ORAL | Status: AC
  Filled 2012-04-02: qty 2

## 2012-04-02 MED ORDER — SODIUM CHLORIDE 0.9 % IJ SOLN: 3.0000 mL | Freq: Two times a day (BID) | INTRAMUSCULAR | Status: DC

## 2012-04-02 MED ORDER — ONDANSETRON HCL 4 MG/2ML IJ SOLN: 4.0000 mg | Freq: Four times a day (QID) | INTRAMUSCULAR | Status: DC | PRN

## 2012-04-02 MED ORDER — NITROGLYCERIN 0.2 MG/ML ON CALL CATH LAB
INTRAVENOUS | Status: AC
  Filled 2012-04-02: qty 1

## 2012-04-02 MED ORDER — VERAPAMIL HCL 2.5 MG/ML IV SOLN
INTRAVENOUS | Status: AC
  Filled 2012-04-02: qty 2

## 2012-04-02 MED ORDER — LIDOCAINE HCL (PF) 1 % IJ SOLN
INTRAMUSCULAR | Status: AC
  Filled 2012-04-02: qty 30

## 2012-04-02 MED ORDER — HYDROMORPHONE HCL PF 2 MG/ML IJ SOLN
INTRAMUSCULAR | Status: AC
  Filled 2012-04-02: qty 1

## 2012-04-02 MED ORDER — SODIUM CHLORIDE 0.9 % IJ SOLN: 3.0000 mL | INTRAMUSCULAR | Status: DC | PRN

## 2012-04-02 MED ORDER — SODIUM CHLORIDE 0.9 % IV SOLN: 250.0000 mL | INTRAVENOUS | Status: DC | PRN

## 2012-04-02 MED ORDER — ACETAMINOPHEN 325 MG PO TABS
650.0000 mg | ORAL_TABLET | ORAL | Status: DC | PRN
  Administered 2012-04-02: 650 mg via ORAL

## 2012-04-02 MED ORDER — LIDOCAINE-EPINEPHRINE 1 %-1:100000 IJ SOLN
INTRAMUSCULAR | Status: AC
  Filled 2012-04-02: qty 1

## 2012-04-02 MED ORDER — MIDAZOLAM HCL 2 MG/2ML IJ SOLN
INTRAMUSCULAR | Status: AC
  Filled 2012-04-02: qty 2

## 2012-04-02 MED ORDER — ADENOSINE 12 MG/4ML IV SOLN
16.0000 mL | Freq: Once | INTRAVENOUS | Status: DC
  Filled 2012-04-02: qty 16

## 2012-04-02 MED ORDER — ASPIRIN 81 MG PO CHEW
324.0000 mg | CHEWABLE_TABLET | ORAL | Status: AC
  Administered 2012-04-02: 324 mg via ORAL
  Filled 2012-04-02: qty 4

## 2012-04-02 MED ORDER — BIVALIRUDIN 250 MG IV SOLR
INTRAVENOUS | Status: AC
  Filled 2012-04-02: qty 250

## 2012-04-02 MED ORDER — HEPARIN (PORCINE) IN NACL 2-0.9 UNIT/ML-% IJ SOLN
INTRAMUSCULAR | Status: AC
  Filled 2012-04-02: qty 1000

## 2012-04-02 MED ORDER — SODIUM CHLORIDE 0.9 % IV SOLN
INTRAVENOUS | Status: DC
  Administered 2012-04-02: 1000 mL via INTRAVENOUS

## 2012-04-02 MED ORDER — SODIUM CHLORIDE 0.9 % IV SOLN: 1.0000 mL/kg/h | INTRAVENOUS | Status: DC

## 2012-04-02 MED ORDER — SODIUM CHLORIDE 0.9 % IV SOLN: INTRAVENOUS | Status: DC

## 2012-04-02 NOTE — H&P (View-Only) (Signed)
Subjective:  No further chest pain since antiplastic. States that he can remember the chest discomfort he had when he presented was a funny feeling in her chest and also some discomfort in the back. No further episodes. Feels well. Has been walking in the room and also to the bathroom without any discomfort.  Objective:  Vital Signs in the last 24 hours: Temp:  [98.5 F (36.9 C)-99.3 F (37.4 C)] 99.3 F (37.4 C) (12/08 0740) Pulse Rate:  [64-86] 86  (12/08 0746) Resp:  [12-18] 14  (12/08 0740) BP: (109-137)/(61-88) 115/62 mmHg (12/08 0746) SpO2:  [96 %-100 %] 96 % (12/08 0740)  Intake/Output from previous day: 12/07 0701 - 12/08 0700 In: 1650 [P.O.:600; I.V.:1050] Out: 2 [Urine:2]  Physical Exam:   General appearance: alert, cooperative, appears stated age and no distress Eyes: conjunctivae/corneas clear. PERRL, EOM's intact. Fundi benign. Neck: no adenopathy, no carotid bruit, no JVD, supple, symmetrical, trachea midline and thyroid not enlarged, symmetric, no tenderness/mass/nodules Neck: JVP - normal, carotids 2+= without bruits Resp: clear to auscultation bilaterally Chest wall: no tenderness Cardio: regular rate and rhythm, S1, S2 normal, no murmur, click, rub or gallop GI: soft, non-tender; bowel sounds normal; no masses,  no organomegaly Extremities: extremities normal, atraumatic, no cyanosis or edema and Right radial arterial access site without any hematoma with presence of good pulse.    Lab Results:  Basename 03/10/12 0605 03/09/12 0642  WBC 8.7 7.6  HGB 13.3 14.9  PLT 182 212    Basename 03/10/12 0605 03/09/12 0642  NA 140 138  K 3.6 4.0  CL 105 102  CO2 24 26  GLUCOSE 99 119*  BUN 11 18  CREATININE 0.89 0.94    Basename 03/10/12 0605 03/09/12 2134  TROPONINI >20.00* >20.00*   Hepatic Function Panel  Basename 03/09/12 0642  PROT 7.4  ALBUMIN 3.9  AST 29  ALT 36  ALKPHOS 60  BILITOT 0.4  BILIDIR --  IBILI --    Basename 03/09/12 1315   CHOL 171   No results found for this basename: PROTIME in the last 72 hours Lipid Panel     Component Value Date/Time   CHOL 171 03/09/2012 1315   TRIG 114 03/09/2012 1315   HDL 36* 03/09/2012 1315   CHOLHDL 4.8 03/09/2012 1315   VLDL 23 03/09/2012 1315   LDLCALC 112* 03/09/2012 1315     Imaging: Imaging results have been reviewed  Cardiac Studies:  EKG: normal EKG, incomplete right bundle branch block normal sinus rhythm, unchanged from previous tracings.   Assessment/Plan:  1. Non-ST elevation myocardial infarction presently doing well. 2. Hyperlipidemia mild  Recommendation: Doing well from cardiac standpoint, troponin markedly elevated and will represent washout of total occlusion and revascularization myocardial injury pattern. I will add CPK totals. Transferred to telemetry and possible discharge in the morning. I may consider repeating cardiac catheterization in 2 weeks to reevaluate distal right coronary artery stenosis.   Gunter Conde,JAGADEESH R, M.D. 03/10/2012, 11:39 AM [facility] Pager: [phone] Office: [phone] If no answer: [phone]  

## 2012-04-02 NOTE — CV Procedure (Signed)
Procedure performed:  Left heart catheterization including hemodynamic monitoring of the left ventricle, selective right and left coronary arteriography. FFR to the distal RCA to evaluate the functional significant of intermediate stenosis   Performed: Femoral arteriogram and closure of the femoral arterial access with Minx .   Indication patient is a 41 year-old male with history of  hyperlipidemia,  who presents with acute MI on 03/09/12 . At that time he had occluded ramus intermediate branch and underwent successful PTCA and angioplasty with implantation 3.0 x 20 mm Promos Premier eluting stent and at that time found to have a 70%-80 hazy distal RCA stenosis. Hence is brought to the cardiac catheterization lab to evaluate his coronary anatomy.  Hemodynamic data:  Left ventricular pressure was 101/5with LVEDP of 14 mm mercury. Aortic pressure was 99/63 with a mean of 82 mm mercury. There was no pressure gradient across the aortic valve.   Left ventricle: Angiogram not performed.   Right coronary artery: The vessel is smooth with mild luminal irregularity. It is dominant. Distal RCA at the bifurcation of a large PL and small PD branch has a 70% stenosis in some views and 70-80% stenosis with haziness in cranial views. There was significant spasm noted in the proximal RCA which was relieved with intracoronary nitroglycerin.  FFR data: The distal RCA stenosis Was felt not to be hemodynamically significant with FFR of 0.86 from baseline 1.0. Hence the lesion was left alone without PCI.  Left main coronary artery is large and normal.   Circumflex coronary artery: A large vessel giving origin to a large obtuse marginal 1. It is smooth and normal.  LAD:  LAD gives origin to a large diagonal 1. D1 has ostial 30-40% stenosis. LAD is otherwise smooth and normal.  Technique: Under sterile precautions using a 6 French right femoral arterial access, a 6 French sheath was introduced into the right  femoral artery under fluoroscopy guidance. A 5 [place] TIG 4  catheter was advanced into the ascending aorta and then into the left ventricle. Hemodynamics are analysed. I was then able to cannulate the left main coronary artery with this catheter and also very right coronary artery. Hence I exchanged the catheter to a 6 [place] at his left fourth and a 6 [place] Judkins right guide catheter and initially left main coronary artery was selected and cannulated and angiography was performed and then the catheter was exchanged to the guide catheter and right coronary artery was cannulated and angiography was performed. Using Angiomax for anticoagulation I then advanced the Abbott prime wire with moderate amount of difficulty and placed that in the PL branch of the RCA. Using intravenous adenosine and under standard protocol after 2 minutes and 20 seconds I performed FFR. It was well documented. As the lesion was insignificant, we went to live and carefully pulled the wire and at the site of the stenosis we could see the pressure dipped normalized. Overall the stenosis was hemodynamically insignificant. Hence the guidewire was withdrawn out of the body  , right femoral artery there was performed and closed with Minx  device. Patient of procedure. No immediate competitions.   Disposition: Will be discharged home today with outpatient follow up. Medical therapy

## 2012-04-02 NOTE — Interval H&P Note (Signed)
History and Physical Interval Note:  04/02/2012 9:25 AM  Justin Keller  has presented today for surgery, with the diagnosis of cp  The various methods of treatment have been discussed with the patient and family. After consideration of risks, benefits and other options for treatment, the patient has consented to  Procedure(s) (LRB) with comments: LEFT HEART CATHETERIZATION WITH CORONARY ANGIOGRAM (Bilateral) and possible angioplasty as a surgical intervention .  The patient's history has been reviewed, patient examined, no change in status, stable for surgery.  I have reviewed the patient's chart and labs.  Questions were answered to the patient's satisfaction.  Patient has residual RCA stenosis that was felt to be significant during primary angioplasty during acute MI   [facility]

## 2012-04-04 ENCOUNTER — Encounter (HOSPITAL_COMMUNITY): Payer: Self-pay | Admitting: *Deleted

## 2012-04-04 ENCOUNTER — Emergency Department (HOSPITAL_COMMUNITY)
Admission: EM | Admit: 2012-04-04 | Discharge: 2012-04-04 | Disposition: A | Discharge status: Home / Self Care | Payer: BC Managed Care – PPO | Attending: Emergency Medicine | Admitting: Emergency Medicine

## 2012-04-04 DIAGNOSIS — Y840 Cardiac catheterization as the cause of abnormal reaction of the patient, or of later complication, without mention of misadventure at the time of the procedure: Secondary | ICD-10-CM | POA: Insufficient documentation

## 2012-04-04 DIAGNOSIS — Z79899 Other long term (current) drug therapy: Secondary | ICD-10-CM | POA: Insufficient documentation

## 2012-04-04 DIAGNOSIS — Z8669 Personal history of other diseases of the nervous system and sense organs: Secondary | ICD-10-CM | POA: Insufficient documentation

## 2012-04-04 DIAGNOSIS — T148XXA Other injury of unspecified body region, initial encounter: Secondary | ICD-10-CM

## 2012-04-04 DIAGNOSIS — Z7982 Long term (current) use of aspirin: Secondary | ICD-10-CM | POA: Insufficient documentation

## 2012-04-04 DIAGNOSIS — IMO0002 Reserved for concepts with insufficient information to code with codable children: Secondary | ICD-10-CM | POA: Insufficient documentation

## 2012-04-04 DIAGNOSIS — I252 Old myocardial infarction: Secondary | ICD-10-CM | POA: Insufficient documentation

## 2012-04-04 MED FILL — Dextrose Inj 5%: INTRAVENOUS | Qty: 50 | Status: AC

## 2012-04-04 NOTE — ED Notes (Signed)
Patient s/p uncomplicated outpatient cardiac cath on 12/31. Patient with bruising x 2 to right femoral area which he states is "old bruising" and has been there since cath was done. Patient c/o  "new" bruising to right upper thigh that he first noticed this AM upon waking. States that it wasn't there last night prior to going to bed. Pt denies any drainage or bleeding from cath site. Pain is mild-moderate and describes as "soreness." denies any SOB or CP. Complete sensation to RLE. MOEx4. NAD noted at this time.

## 2012-04-04 NOTE — ED Provider Notes (Signed)
History     CSN: [id number]  Arrival date & time 04/04/12  1478   First MD Initiated Contact with Patient 04/04/12 [phone]      Chief Complaint  Patient presents with  . post cath bruise in femoral area     (Consider location/radiation/quality/duration/timing/severity/associated sxs/prior treatment) HPI 42 year old male presents the emergency department for evaluation of catheter site.  Patient had MI recently (12/7).  Tuesday 2 days ago, patient had catheterization and stent placement.  He was told to followup if there was swelling greater than the size of a quarter at the catheter site.  Patient has bruising and swelling to the right groin.  He denies any involvement of the penis or scrotum.  He is urinating without problem.  The patient is able to ambulate.  His pain is minimal. Denies fevers, chills, myalgias, arthralgias. Denies DOE, SOB, chest tightness or pressure, radiation to left arm, jaw or back, or diaphoresis. Denies dysuria, flank pain, suprapubic pain, frequency, urgency, or hematuria. Denies headaches, light headedness, weakness, visual disturbances. Denies abdominal pain, nausea, vomiting, diarrhea or constipation.   Past Medical History  Diagnosis Date  . Sleep apnea   . Headache   . Myocardial infarction     Past Surgical History  Procedure Date  . Coronary stent placement     History reviewed. No pertinent family history.  History  Substance Use Topics  . Smoking status: Never Smoker   . Smokeless tobacco: Not on file  . Alcohol Use: Yes      Review of Systems Ten systems reviewed and are negative for acute change, except as noted in the HPI.   Allergies  Review of patient's allergies indicates no known allergies.  Home Medications   Current Outpatient Rx  Name  Route  Sig  Dispense  Refill  . ASPIRIN 81 MG PO CHEW   Oral   Chew 1 tablet (81 mg total) by mouth daily.         . ATORVASTATIN CALCIUM 80 MG PO TABS   Oral   Take 1 tablet (80 mg  total) by mouth daily at 6 PM.   30 tablet   2   . CARVEDILOL 3.125 MG PO TABS   Oral   Take 1 tablet (3.125 mg total) by mouth 2 (two) times daily with a meal.   60 tablet   1   . LISINOPRIL 2.5 MG PO TABS   Oral   Take 1 tablet (2.5 mg total) by mouth daily.   30 tablet   2   . NITROGLYCERIN 0.4 MG SL SUBL   Sublingual   Place 1 tablet (0.4 mg total) under the tongue every 5 (five) minutes x 3 doses as needed for chest pain.   30 tablet   2   . PRASUGREL HCL 10 MG PO TABS   Oral   Take 1 tablet (10 mg total) by mouth daily.   30 tablet   0     You will need refills on this for one year. Do not ...     BP 118/49  Pulse 63  Temp 97.8 F (36.6 C) (Oral)  Resp 18  SpO2 100%  Physical Exam Physical Exam  Nursing note and vitals reviewed. Constitutional: She is oriented to person, place, and time. She appears well-developed and well-nourished. No distress.  HENT:  Head: Normocephalic and atraumatic.  Eyes: Conjunctivae normal and EOM are normal. Pupils are equal, round, and reactive to light. No scleral icterus.  Neck: Normal  range of motion.  Cardiovascular: Normal rate, regular rhythm and normal heart sounds.  Exam reveals no gallop and no friction rub.   No murmur heard. Pulmonary/Chest: Effort normal and breath sounds normal. No respiratory distress.  Abdominal: Soft. Bowel sounds are normal. She exhibits no distension and no mass. There is no tenderness. There is no guarding.  Neurological: She is alert and oriented to person, place, and time.  Skin: Skin is warm and dry. She is not diaphoretic.  suture intact. No signs of infection at the cath site including heat, erythema, or purulent dc. There is moderate swelling and superficial ecchymosis to the right groin which may represent hematoma.    ED Course  Procedures (including critical care time)  Labs Reviewed - No data to display No results found.   No diagnosis found.    MDM  7:43 AM BP 118/49   Pulse 63  Temp 97.8 F (36.6 C) (Oral)  Resp 18  SpO2 100% Patient with well healing site of cath at the right groin.  He may have developing hematoma. Supportive care discussed and follow up with Dr. Jacinto Halim. At this time there does not appear to be any evidence of an acute emergency medical condition and the patient appears stable for discharge with appropriate outpatient follow up.Diagnosis was discussed with patient who verbalizes understanding and is agreeable to discharge. Pt case discussed with Dr. Effie Shy who agrees with my plan.          Arthor Captain, PA-C 04/04/12 0745  Arthor Captain, PA-C 04/04/12 [phone]

## 2012-04-04 NOTE — ED Notes (Signed)
Pt states he was cathed and was told to come back in if he had an abnormal bruising bigger than a quarter. Pt states this morning he noticed bruising in his right femoral/groin area.

## 2012-04-04 NOTE — ED Provider Notes (Signed)
Medical screening examination/treatment/procedure(s) were performed by non-physician practitioner and as supervising physician I was immediately available for consultation/collaboration.   Flint Melter, MD 04/04/12 [phone]

## 2012-05-09 ENCOUNTER — Encounter (HOSPITAL_COMMUNITY)
Admission: RE | Admit: 2012-05-09 | Discharge: 2012-05-09 | Disposition: A | Discharge status: Home / Self Care | Payer: BC Managed Care – PPO | Source: Ambulatory Visit | Attending: Cardiology | Admitting: Cardiology

## 2012-05-09 NOTE — Progress Notes (Signed)
Cardiac Rehab Medication Review by a Pharmacist  Does the patient  feel that his/her medications are working for him/her?  yes  Has the patient been experiencing any side effects to the medications prescribed?  Yes- cold extremities with prasugrel  Does the patient measure his/her own blood pressure or blood glucose at home?  no   Does the patient have any problems obtaining medications due to transportation or finances?   no  Understanding of regimen: excellent Understanding of indications: excellent Potential of compliance: excellent    Pharmacist comments: Patient has excellent understanding of his medications and great compliance. Reviewed indications and mechanisms of actions for drug classes. Indicates only side effect he is having is cold extremities.  Bola A. Wandra Feinstein D Clinical Pharmacist Pager:[phone] Phone [phone] 05/09/2012 9:15 AM

## 2012-05-13 ENCOUNTER — Encounter (HOSPITAL_COMMUNITY): Payer: BC Managed Care – PPO

## 2012-05-15 ENCOUNTER — Encounter (HOSPITAL_COMMUNITY): Payer: BC Managed Care – PPO

## 2012-05-17 ENCOUNTER — Encounter (HOSPITAL_COMMUNITY): Payer: BC Managed Care – PPO

## 2012-05-20 ENCOUNTER — Encounter (HOSPITAL_COMMUNITY): Payer: BC Managed Care – PPO

## 2012-05-22 ENCOUNTER — Encounter (HOSPITAL_COMMUNITY): Payer: BC Managed Care – PPO

## 2012-05-24 ENCOUNTER — Encounter (HOSPITAL_COMMUNITY): Payer: BC Managed Care – PPO

## 2012-05-27 ENCOUNTER — Encounter (HOSPITAL_COMMUNITY): Payer: BC Managed Care – PPO

## 2012-05-29 ENCOUNTER — Encounter (HOSPITAL_COMMUNITY): Payer: BC Managed Care – PPO

## 2012-05-31 ENCOUNTER — Encounter (HOSPITAL_COMMUNITY): Payer: BC Managed Care – PPO

## 2012-06-03 ENCOUNTER — Encounter (HOSPITAL_COMMUNITY): Payer: BC Managed Care – PPO

## 2012-06-05 ENCOUNTER — Encounter (HOSPITAL_COMMUNITY): Payer: BC Managed Care – PPO

## 2012-06-07 ENCOUNTER — Encounter (HOSPITAL_COMMUNITY): Payer: BC Managed Care – PPO

## 2012-06-10 ENCOUNTER — Encounter (HOSPITAL_COMMUNITY): Payer: BC Managed Care – PPO

## 2012-06-12 ENCOUNTER — Encounter (HOSPITAL_COMMUNITY): Payer: BC Managed Care – PPO

## 2012-06-14 ENCOUNTER — Encounter (HOSPITAL_COMMUNITY): Payer: BC Managed Care – PPO

## 2012-06-17 ENCOUNTER — Encounter (HOSPITAL_COMMUNITY): Payer: BC Managed Care – PPO

## 2012-06-19 ENCOUNTER — Encounter (HOSPITAL_COMMUNITY): Payer: BC Managed Care – PPO

## 2012-06-21 ENCOUNTER — Encounter (HOSPITAL_COMMUNITY): Payer: BC Managed Care – PPO

## 2012-06-24 ENCOUNTER — Encounter (HOSPITAL_COMMUNITY): Payer: BC Managed Care – PPO

## 2012-06-26 ENCOUNTER — Encounter (HOSPITAL_COMMUNITY): Payer: BC Managed Care – PPO

## 2012-06-28 ENCOUNTER — Encounter (HOSPITAL_COMMUNITY): Payer: BC Managed Care – PPO

## 2012-07-01 ENCOUNTER — Encounter (HOSPITAL_COMMUNITY): Payer: BC Managed Care – PPO

## 2012-07-01 ENCOUNTER — Telehealth (HOSPITAL_COMMUNITY): Payer: Self-pay | Admitting: Cardiac Rehabilitation

## 2012-07-01 NOTE — Telephone Encounter (Signed)
Left message with pt to assess intent to participate in cardiac rehab.

## 2012-07-03 ENCOUNTER — Encounter (HOSPITAL_COMMUNITY): Admission: RE | Admit: 2012-07-03 | Payer: BC Managed Care – PPO | Source: Ambulatory Visit

## 2012-07-05 ENCOUNTER — Encounter (HOSPITAL_COMMUNITY): Payer: BC Managed Care – PPO

## 2012-07-08 ENCOUNTER — Encounter (HOSPITAL_COMMUNITY): Payer: Self-pay | Admitting: Cardiac Rehabilitation

## 2012-07-08 ENCOUNTER — Encounter (HOSPITAL_COMMUNITY): Payer: BC Managed Care – PPO

## 2012-07-10 ENCOUNTER — Encounter (HOSPITAL_COMMUNITY): Payer: BC Managed Care – PPO

## 2012-07-12 ENCOUNTER — Encounter (HOSPITAL_COMMUNITY): Payer: BC Managed Care – PPO

## 2012-07-15 ENCOUNTER — Encounter (HOSPITAL_COMMUNITY): Payer: BC Managed Care – PPO

## 2012-07-17 ENCOUNTER — Encounter (HOSPITAL_COMMUNITY): Payer: BC Managed Care – PPO

## 2012-07-19 ENCOUNTER — Encounter (HOSPITAL_COMMUNITY): Payer: BC Managed Care – PPO

## 2012-07-22 ENCOUNTER — Encounter (HOSPITAL_COMMUNITY): Payer: BC Managed Care – PPO

## 2012-07-24 ENCOUNTER — Encounter (HOSPITAL_COMMUNITY): Payer: BC Managed Care – PPO

## 2012-07-26 ENCOUNTER — Encounter (HOSPITAL_COMMUNITY): Payer: BC Managed Care – PPO

## 2012-07-29 ENCOUNTER — Encounter (HOSPITAL_COMMUNITY): Payer: BC Managed Care – PPO

## 2012-07-31 ENCOUNTER — Encounter (HOSPITAL_COMMUNITY): Payer: BC Managed Care – PPO

## 2012-08-02 ENCOUNTER — Encounter (HOSPITAL_COMMUNITY): Payer: BC Managed Care – PPO

## 2012-08-05 ENCOUNTER — Encounter (HOSPITAL_COMMUNITY): Payer: BC Managed Care – PPO

## 2012-08-07 ENCOUNTER — Encounter (HOSPITAL_COMMUNITY): Payer: BC Managed Care – PPO

## 2012-08-09 ENCOUNTER — Encounter (HOSPITAL_COMMUNITY): Payer: BC Managed Care – PPO

## 2012-08-12 ENCOUNTER — Encounter (HOSPITAL_COMMUNITY): Payer: BC Managed Care – PPO

## 2012-08-14 ENCOUNTER — Encounter (HOSPITAL_COMMUNITY): Payer: BC Managed Care – PPO

## 2012-08-16 ENCOUNTER — Encounter (HOSPITAL_COMMUNITY): Payer: BC Managed Care – PPO

## 2013-02-17 ENCOUNTER — Encounter (HOSPITAL_COMMUNITY): Payer: Self-pay | Admitting: Emergency Medicine

## 2013-02-17 ENCOUNTER — Emergency Department (HOSPITAL_COMMUNITY)
Admission: EM | Admit: 2013-02-17 | Discharge: 2013-02-17 | Disposition: A | Discharge status: Home / Self Care | Payer: BC Managed Care – PPO | Attending: Emergency Medicine | Admitting: Emergency Medicine

## 2013-02-17 ENCOUNTER — Emergency Department (HOSPITAL_COMMUNITY): Payer: BC Managed Care – PPO

## 2013-02-17 DIAGNOSIS — R42 Dizziness and giddiness: Secondary | ICD-10-CM | POA: Insufficient documentation

## 2013-02-17 DIAGNOSIS — I252 Old myocardial infarction: Secondary | ICD-10-CM | POA: Insufficient documentation

## 2013-02-17 DIAGNOSIS — R55 Syncope and collapse: Secondary | ICD-10-CM | POA: Insufficient documentation

## 2013-02-17 DIAGNOSIS — Z79899 Other long term (current) drug therapy: Secondary | ICD-10-CM | POA: Insufficient documentation

## 2013-02-17 DIAGNOSIS — R002 Palpitations: Secondary | ICD-10-CM | POA: Insufficient documentation

## 2013-02-17 DIAGNOSIS — Z7982 Long term (current) use of aspirin: Secondary | ICD-10-CM | POA: Insufficient documentation

## 2013-02-17 LAB — URINALYSIS W MICROSCOPIC + REFLEX CULTURE
Bilirubin Urine: NEGATIVE
Glucose, UA: NEGATIVE mg/dL
Hgb urine dipstick: NEGATIVE
Ketones, ur: NEGATIVE mg/dL
Leukocytes, UA: NEGATIVE
Protein, ur: NEGATIVE mg/dL
pH: 6.5 (ref 5.0–8.0)

## 2013-02-17 LAB — BASIC METABOLIC PANEL
BUN: 16 mg/dL (ref 6–23)
Calcium: 9.2 mg/dL (ref 8.4–10.5)
GFR calc non Af Amer: >90 mL/min (ref >90)
Glucose, Bld: 90 mg/dL (ref 70–99)

## 2013-02-17 LAB — CBC
HCT: 43.5 % (ref 39.0–52.0)
Hemoglobin: 15.6 g/dL (ref 13.0–17.0)
MCH: 33.8 pg (ref 26.0–34.0)
MCHC: 35.9 g/dL (ref 30.0–36.0)
RDW: 13.5 % (ref 11.5–15.5)

## 2013-02-17 LAB — POCT I-STAT, CHEM 8
BUN: 20 mg/dL (ref 6–23)
Chloride: 103 mEq/L (ref 96–112)
Creatinine, Ser: 1 mg/dL (ref 0.50–1.35)
Glucose, Bld: 94 mg/dL (ref 70–99)
Hemoglobin: 15.3 g/dL (ref 13.0–17.0)
Potassium: 4.1 mEq/L (ref 3.5–5.1)
Sodium: 139 mEq/L (ref 135–145)

## 2013-02-17 NOTE — ED Provider Notes (Signed)
CSN: [id number]     Arrival date & time 02/17/13  1111 History   First MD Initiated Contact with Patient 02/17/13 1216     Chief Complaint  Patient presents with  . Palpitations  . Near Syncope    HPI Pt was seen at 1245. Per pt, c/o sudden onset and persistence of multiple intermittent episodes of palpitations for the past 1 week, last episode last night. Pt states he did not take his HR just "felt like my heart was pounding." Denies CP/SOB. Pt states today PTA, while "talking on the phone to my accountant about finances" he had an episode of lightheadedness/near syncope. States the fingers on both of his hands became "tingling and numb." States he laid down with improvement in his symptoms. Denies any associated symptoms of CP/palpitations/SOB during this episode.  Denies irregular HR, no CP, no SOB/cough, no abd pain, no N/V/D, no fevers, no back pain, no syncope, no focal motor weakness, no tingling/numbness in extremities.    Past Medical History  Diagnosis Date  . Sleep apnea   . Headache(784.0)   . Myocardial infarction    Past Surgical History  Procedure Laterality Date  . Coronary stent placement      History  Substance Use Topics  . Smoking status: Never Smoker   . Smokeless tobacco: Not on file  . Alcohol Use: Yes    Review of Systems ROS: Statement: All systems negative except as marked or noted in the HPI; Constitutional: Negative for fever and chills. ; ; Eyes: Negative for eye pain, redness and discharge. ; ; ENMT: Negative for ear pain, hoarseness, nasal congestion, sinus pressure and sore throat. ; ; Cardiovascular: +palpitations. Negative for chest pain, diaphoresis, dyspnea and peripheral edema. ; ; Respiratory: Negative for cough, wheezing and stridor. ; ; Gastrointestinal: Negative for nausea, vomiting, diarrhea, abdominal pain, blood in stool, hematemesis, jaundice and rectal bleeding. . ; ; Genitourinary: Negative for dysuria, flank pain and hematuria. ; ;  Musculoskeletal: Negative for back pain and neck pain. Negative for swelling and trauma.; ; Skin: Negative for pruritus, rash, abrasions, blisters, bruising and skin lesion.; ; Neuro: +lightheadedness. Negative for headache and neck stiffness. Negative for weakness, altered level of consciousness , altered mental status, extremity weakness, paresthesias, involuntary movement, seizure and syncope.       Allergies  Review of patient's allergies indicates no known allergies.  Home Medications   Current Outpatient Rx  Name  Route  Sig  Dispense  Refill  . aspirin 81 MG chewable tablet   Oral   Chew 1 tablet (81 mg total) by mouth daily.         Marland Kitchen atorvastatin (LIPITOR) 20 MG tablet   Oral   Take 20 mg by mouth daily.         . carvedilol (COREG) 3.125 MG tablet   Oral   Take 3.125 mg by mouth daily.         Marland Kitchen loratadine (CLARITIN) 10 MG tablet   Oral   Take 10 mg by mouth daily as needed for allergies.         Marland Kitchen losartan (COZAAR) 25 MG tablet   Oral   Take 25 mg by mouth daily.         . Multiple Vitamin (MULTIVITAMIN) tablet   Oral   Take 1 tablet by mouth daily.         . nitroGLYCERIN (NITROSTAT) 0.4 MG SL tablet   Sublingual   Place 1 tablet (0.4 mg total)  under the tongue every 5 (five) minutes x 3 doses as needed for chest pain.   30 tablet   2   . prasugrel (EFFIENT) 10 MG TABS   Oral   Take 1 tablet (10 mg total) by mouth daily.   30 tablet   0     You will need refills on this for one year. Do not ...    BP 127/82  Pulse 78  Resp 16  Ht 5\' 9"  (1.753 m)  Wt 174 lb (78.926 kg)  BMI 25.68 kg/m2  SpO2 99% Physical Exam 1250: Physical examination:  Nursing notes reviewed; Vital signs and O2 SAT reviewed;  Constitutional: Well developed, Well nourished, Well hydrated, In no acute distress; Head:  Normocephalic, atraumatic; Eyes: EOMI, PERRL, No scleral icterus; ENMT: Mouth and pharynx normal, Mucous membranes moist; Neck: Supple, Full range of  motion, No lymphadenopathy; Cardiovascular: Regular rate and rhythm, No gallop; Respiratory: Breath sounds clear & equal bilaterally, No wheezes.  Speaking full sentences with ease, Normal respiratory effort/excursion; Chest: Nontender, Movement normal; Abdomen: Soft, Nontender, Nondistended, Normal bowel sounds; Genitourinary: No CVA tenderness; Extremities: Pulses normal, No tenderness, No edema, No calf edema or asymmetry.; Neuro: AA&Ox3, Major CN grossly intact.  Strength 5/5 equal bilat UE's and LE's.  DTR 2/4 equal bilat UE's and LE's.  No gross sensory deficits.  Normal cerebellar testing bilat UE's (finger-nose) and LE's (heel-shin). Speech clear.  No facial droop.  No nystagmus. Climbs on and off stretcher easily by himself. Gait steady..; Skin: Color normal, Warm, Dry.; Psych:  Anxious.    ED Course  Procedures    EKG Interpretation    Date/Time:  Monday February 17 2013 11:17:32 EST Ventricular Rate:  62 PR Interval:  146 QRS Duration: 102 QT Interval:  397 QTC Calculation: 403 R Axis:   88 Text Interpretation:  Sinus rhythm RSR' in V1 or V2, right VCD or RVH No significant change since last tracing dated 04/02/2012            MDM  MDM Reviewed: previous chart, nursing note and vitals Reviewed previous: labs and ECG Interpretation: labs, ECG and x-ray      Results for orders placed during the hospital encounter of 02/17/13  CBC      Result Value Range   WBC 12.2 (*) 4.0 - 10.5 K/uL   RBC 4.62  4.22 - 5.81 MIL/uL   Hemoglobin 15.6  13.0 - 17.0 g/dL   HCT 40.9  81.1 - 91.4 %   MCV 94.2  78.0 - 100.0 fL   MCH 33.8  26.0 - 34.0 pg   MCHC 35.9  30.0 - 36.0 g/dL   RDW 78.2  95.6 - 21.3 %   Platelets 245  150 - 400 K/uL  BASIC METABOLIC PANEL      Result Value Range   Sodium 133 (*) 135 - 145 mEq/L   Potassium 5.8 (*) 3.5 - 5.1 mEq/L   Chloride 100  96 - 112 mEq/L   CO2 19  19 - 32 mEq/L   Glucose, Bld 90  70 - 99 mg/dL   BUN 16  6 - 23 mg/dL   Creatinine,  Ser 0.86  0.50 - 1.35 mg/dL   Calcium 9.2  8.4 - 57.8 mg/dL   GFR calc non Af Amer >90  >90 mL/min   GFR calc Af Amer >90  >90 mL/min  TROPONIN I      Result Value Range   Troponin I <0.30  <0.30 ng/mL  URINALYSIS W MICROSCOPIC + REFLEX CULTURE      Result Value Range   Color, Urine YELLOW  YELLOW   APPearance CLEAR  CLEAR   Specific Gravity, Urine 1.014  1.005 - 1.030   pH 6.5  5.0 - 8.0   Glucose, UA NEGATIVE  NEGATIVE mg/dL   Hgb urine dipstick NEGATIVE  NEGATIVE   Bilirubin Urine NEGATIVE  NEGATIVE   Ketones, ur NEGATIVE  NEGATIVE mg/dL   Protein, ur NEGATIVE  NEGATIVE mg/dL   Urobilinogen, UA 0.2  0.0 - 1.0 mg/dL   Nitrite NEGATIVE  NEGATIVE   Leukocytes, UA NEGATIVE  NEGATIVE   WBC, UA    <3 WBC/hpf   Value: NO FORMED ELEMENTS SEEN ON URINE MICROSCOPIC EXAMINATION   RBC / HPF    <3 RBC/hpf   Value: NO FORMED ELEMENTS SEEN ON URINE MICROSCOPIC EXAMINATION  POCT I-STAT TROPONIN I      Result Value Range   Troponin i, poc 0.00  0.00 - 0.08 ng/mL   Comment 3           POCT I-STAT, CHEM 8      Result Value Range   Sodium 139  135 - 145 mEq/L   Potassium 4.1  3.5 - 5.1 mEq/L   Chloride 103  96 - 112 mEq/L   BUN 20  6 - 23 mg/dL   Creatinine, Ser 1.61  0.50 - 1.35 mg/dL   Glucose, Bld 94  70 - 99 mg/dL   Calcium, Ion 0.96  1.12 - 1.23 mmol/L   TCO2 26  0 - 100 mmol/L   Hemoglobin 15.3  13.0 - 17.0 g/dL   HCT 04.5  40.9 - 81.1 %    1415:  BMP repeated due to hemolysis; potassium normal. Pt wants to go home now. Pt not orthostatic. Has been ambulatory in the ED with steady gait. Neuro exam unchanged. Denies symptoms while in the ED. T/C to Cardiology Dr. Jacinto Halim, case discussed, including:  HPI, pertinent PM/SHx, VS/PE, dx testing, ED course and treatment: knows pt well, ok to d/c pt, do not change his meds at this time, have him call the ofc for f/u this week. Dx and testing, as well as d/w Dr. Jacinto Halim, d/w pt and family.  Questions answered.  Verb understanding, agreeable to d/c  home with outpt f/u.    Laray Anger, DO 02/19/13 1958

## 2013-02-17 NOTE — ED Notes (Signed)
Hx of MI in [place] artery in December.  EMS states Cardiac Symptoms.  Denies chest pain EMS states pt said his chest was pounding last night and took a baby aspirin. Pt feels "Crappy" Pt currently feels lightheaded and dizzy.  324 of Asprin and 2 nitro at home. EMS did not give any meds

## 2014-02-27 IMAGING — CR DG CHEST 2V
2 series · 2 of 2 positions shown · non-contrast
Comparison: 11/20/2012.

CLINICAL DATA: Left-sided lower chest pain, and shortness of breath

EXAM:
CHEST  2 VIEW

[view not recorded (1 of 2)]
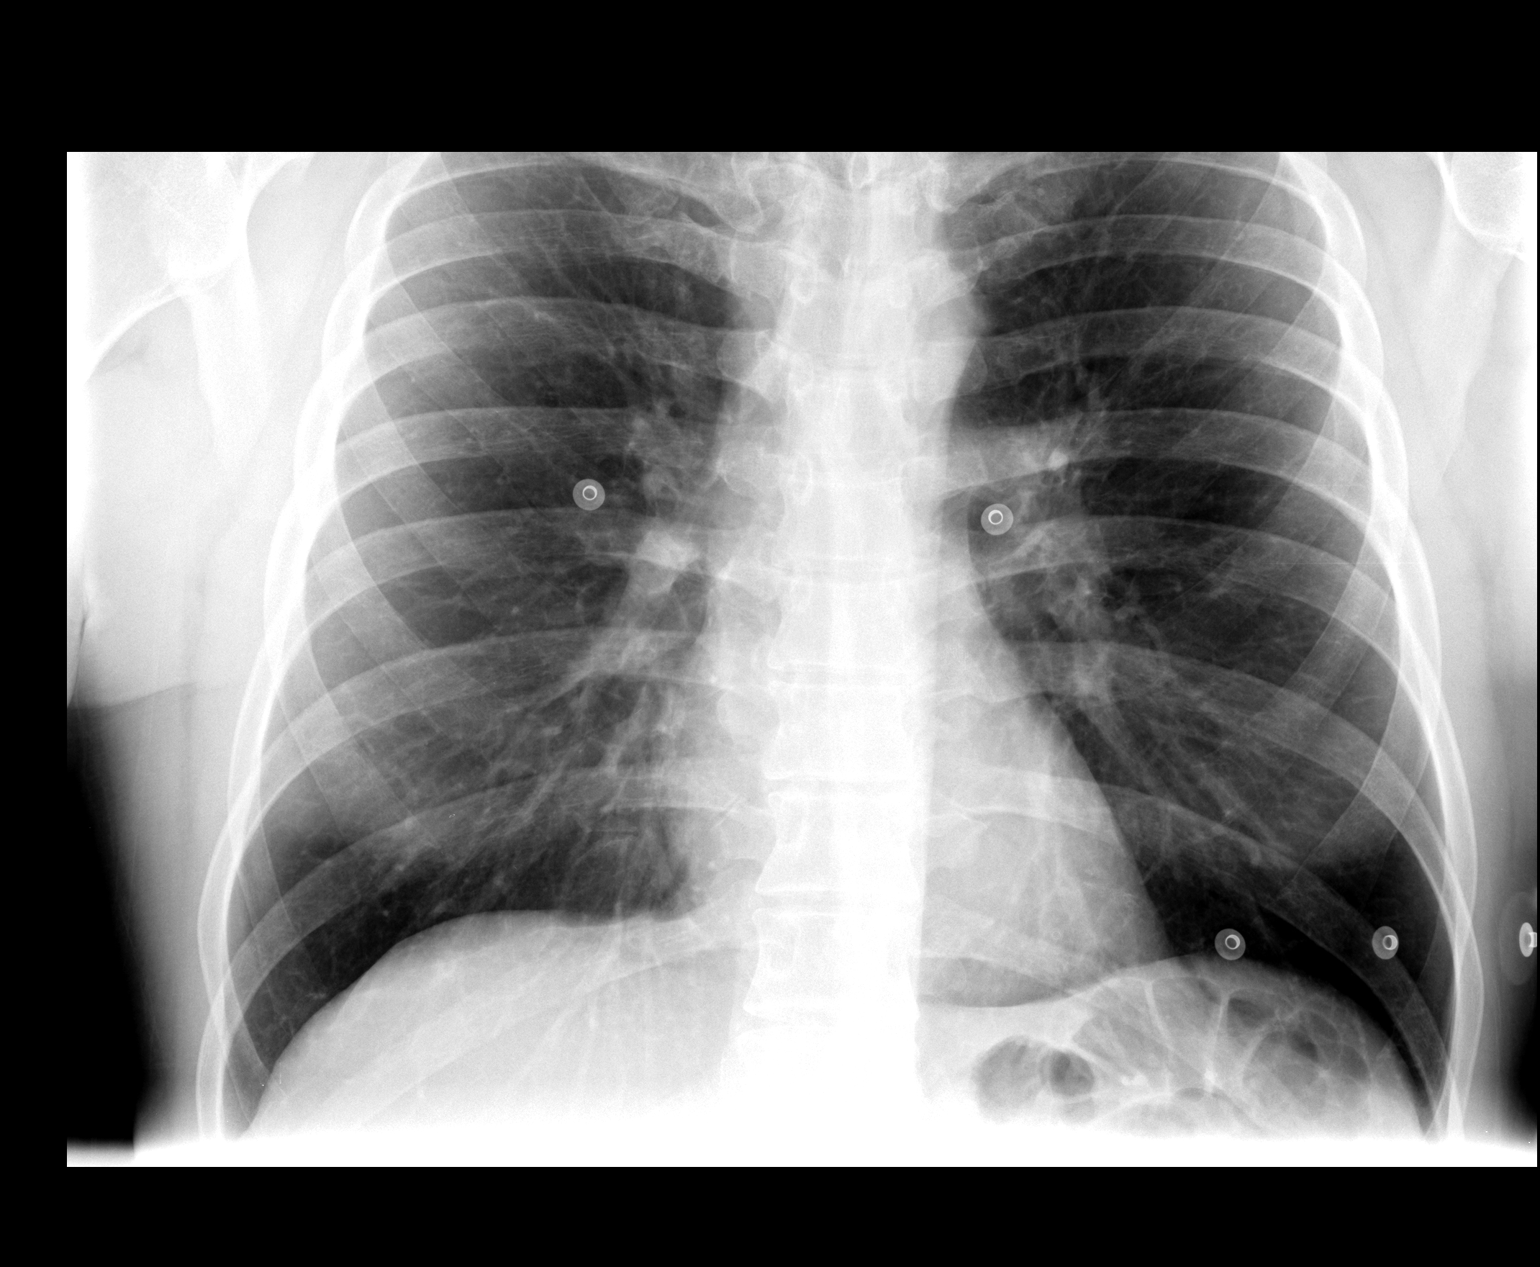

[view not recorded (2 of 2)]
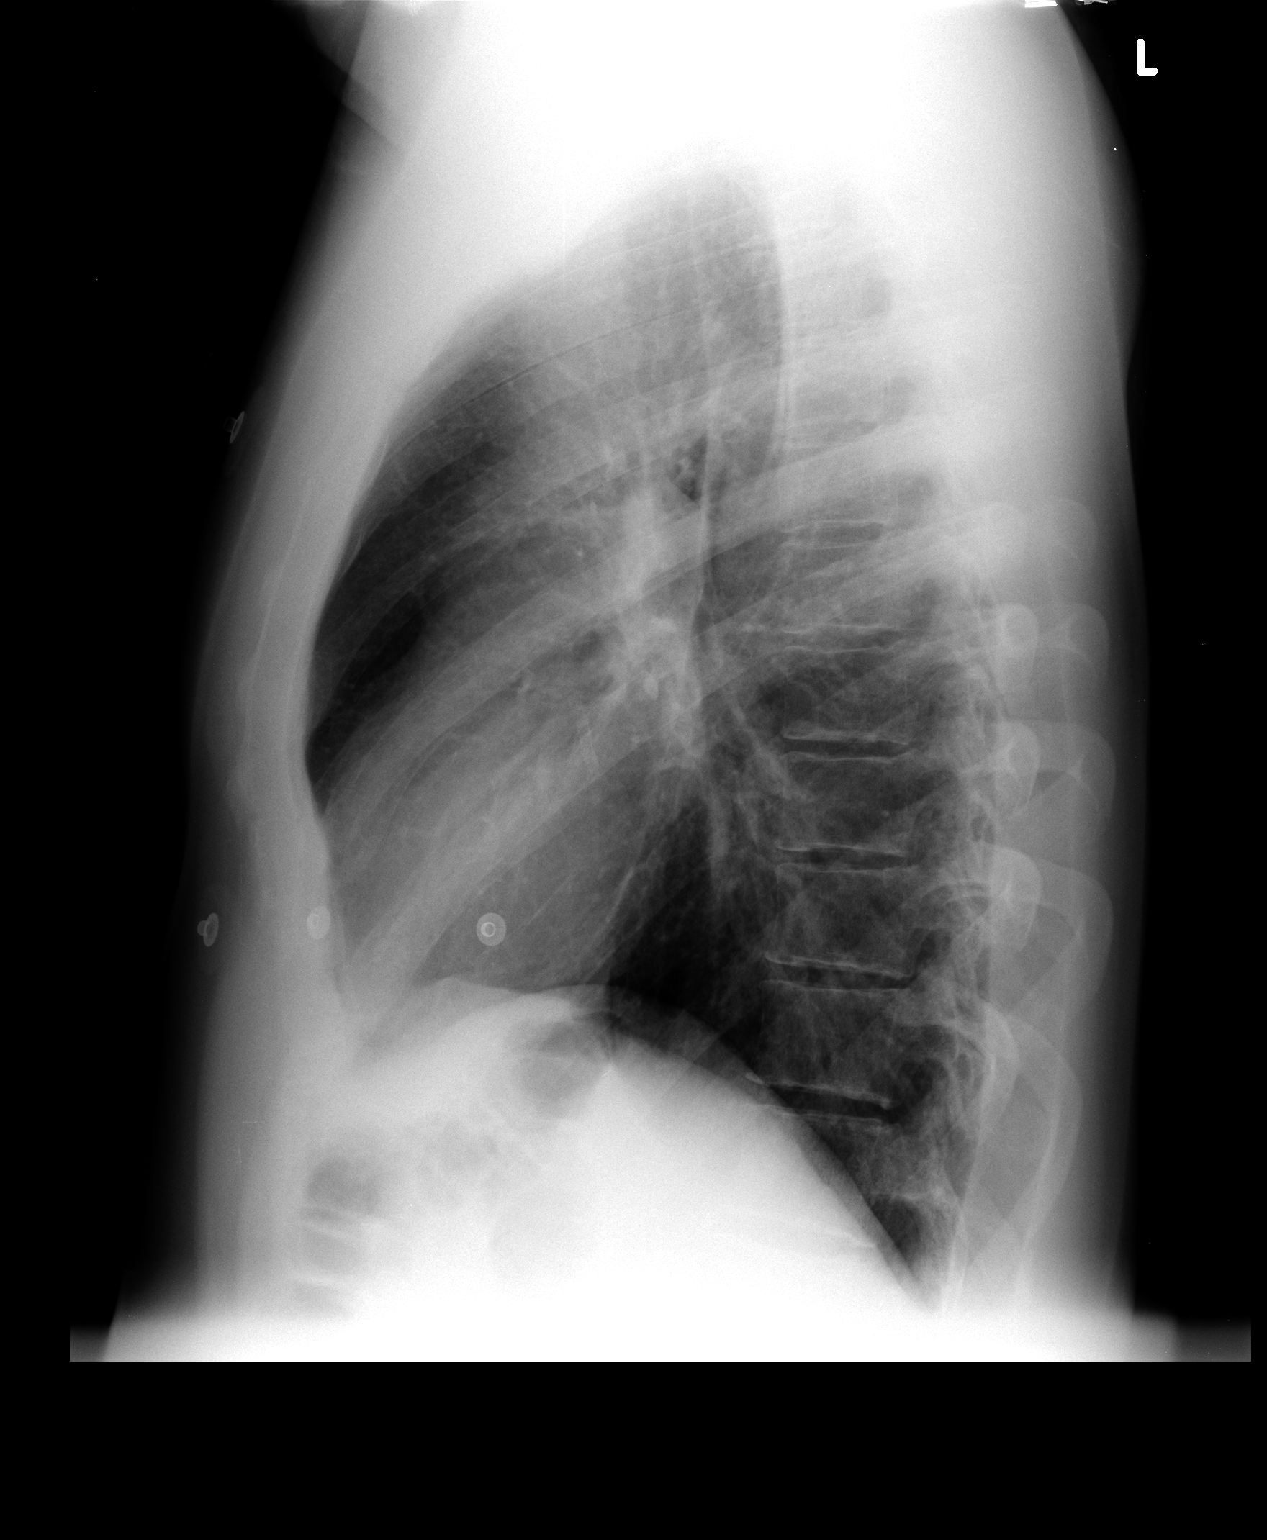

[2 of 2 positions shown; findings below may reference images not displayed]

FINDINGS: The heart size and mediastinal contours are within normal limits.
Both lungs are clear. The visualized skeletal structures are
unremarkable.
IMPRESSION: No active cardiopulmonary disease.

## 2014-03-12 ENCOUNTER — Encounter (HOSPITAL_COMMUNITY): Payer: Self-pay | Admitting: Cardiology

## 2017-08-07 DIAGNOSIS — J3089 Other allergic rhinitis: Secondary | ICD-10-CM | POA: Diagnosis not present

## 2017-08-07 DIAGNOSIS — J3081 Allergic rhinitis due to animal (cat) (dog) hair and dander: Secondary | ICD-10-CM | POA: Diagnosis not present

## 2017-08-07 DIAGNOSIS — J301 Allergic rhinitis due to pollen: Secondary | ICD-10-CM | POA: Diagnosis not present

## 2017-08-21 DIAGNOSIS — J3089 Other allergic rhinitis: Secondary | ICD-10-CM | POA: Diagnosis not present

## 2017-08-21 DIAGNOSIS — J3081 Allergic rhinitis due to animal (cat) (dog) hair and dander: Secondary | ICD-10-CM | POA: Diagnosis not present

## 2017-08-21 DIAGNOSIS — J301 Allergic rhinitis due to pollen: Secondary | ICD-10-CM | POA: Diagnosis not present

## 2017-08-23 DIAGNOSIS — J3089 Other allergic rhinitis: Secondary | ICD-10-CM | POA: Diagnosis not present

## 2017-08-23 DIAGNOSIS — J3081 Allergic rhinitis due to animal (cat) (dog) hair and dander: Secondary | ICD-10-CM | POA: Diagnosis not present

## 2017-08-23 DIAGNOSIS — J301 Allergic rhinitis due to pollen: Secondary | ICD-10-CM | POA: Diagnosis not present

## 2017-09-12 DIAGNOSIS — J019 Acute sinusitis, unspecified: Secondary | ICD-10-CM | POA: Diagnosis not present

## 2017-09-18 DIAGNOSIS — R5383 Other fatigue: Secondary | ICD-10-CM | POA: Diagnosis not present

## 2017-09-18 DIAGNOSIS — R1031 Right lower quadrant pain: Secondary | ICD-10-CM | POA: Diagnosis not present

## 2017-09-18 DIAGNOSIS — J302 Other seasonal allergic rhinitis: Secondary | ICD-10-CM | POA: Diagnosis not present

## 2017-09-25 DIAGNOSIS — J3081 Allergic rhinitis due to animal (cat) (dog) hair and dander: Secondary | ICD-10-CM | POA: Diagnosis not present

## 2017-09-25 DIAGNOSIS — J301 Allergic rhinitis due to pollen: Secondary | ICD-10-CM | POA: Diagnosis not present

## 2017-09-25 DIAGNOSIS — J3089 Other allergic rhinitis: Secondary | ICD-10-CM | POA: Diagnosis not present

## 2017-11-28 DIAGNOSIS — J3089 Other allergic rhinitis: Secondary | ICD-10-CM | POA: Diagnosis not present

## 2017-11-28 DIAGNOSIS — J301 Allergic rhinitis due to pollen: Secondary | ICD-10-CM | POA: Diagnosis not present

## 2017-11-28 DIAGNOSIS — J3081 Allergic rhinitis due to animal (cat) (dog) hair and dander: Secondary | ICD-10-CM | POA: Diagnosis not present

## 2017-12-04 DIAGNOSIS — J3089 Other allergic rhinitis: Secondary | ICD-10-CM | POA: Diagnosis not present

## 2017-12-04 DIAGNOSIS — J3081 Allergic rhinitis due to animal (cat) (dog) hair and dander: Secondary | ICD-10-CM | POA: Diagnosis not present

## 2017-12-04 DIAGNOSIS — J301 Allergic rhinitis due to pollen: Secondary | ICD-10-CM | POA: Diagnosis not present

## 2017-12-20 DIAGNOSIS — J301 Allergic rhinitis due to pollen: Secondary | ICD-10-CM | POA: Diagnosis not present

## 2017-12-20 DIAGNOSIS — J3089 Other allergic rhinitis: Secondary | ICD-10-CM | POA: Diagnosis not present

## 2017-12-27 DIAGNOSIS — J301 Allergic rhinitis due to pollen: Secondary | ICD-10-CM | POA: Diagnosis not present

## 2017-12-27 DIAGNOSIS — J3081 Allergic rhinitis due to animal (cat) (dog) hair and dander: Secondary | ICD-10-CM | POA: Diagnosis not present

## 2017-12-27 DIAGNOSIS — J3089 Other allergic rhinitis: Secondary | ICD-10-CM | POA: Diagnosis not present

## 2017-12-31 DIAGNOSIS — J3081 Allergic rhinitis due to animal (cat) (dog) hair and dander: Secondary | ICD-10-CM | POA: Diagnosis not present

## 2017-12-31 DIAGNOSIS — J3089 Other allergic rhinitis: Secondary | ICD-10-CM | POA: Diagnosis not present

## 2017-12-31 DIAGNOSIS — J301 Allergic rhinitis due to pollen: Secondary | ICD-10-CM | POA: Diagnosis not present

## 2018-01-15 DIAGNOSIS — J039 Acute tonsillitis, unspecified: Secondary | ICD-10-CM | POA: Diagnosis not present

## 2018-03-11 DIAGNOSIS — I2510 Atherosclerotic heart disease of native coronary artery without angina pectoris: Secondary | ICD-10-CM | POA: Diagnosis not present

## 2018-04-09 DIAGNOSIS — M9903 Segmental and somatic dysfunction of lumbar region: Secondary | ICD-10-CM | POA: Diagnosis not present

## 2018-04-09 DIAGNOSIS — M5442 Lumbago with sciatica, left side: Secondary | ICD-10-CM | POA: Diagnosis not present

## 2018-04-09 DIAGNOSIS — M9905 Segmental and somatic dysfunction of pelvic region: Secondary | ICD-10-CM | POA: Diagnosis not present

## 2018-04-10 DIAGNOSIS — M9905 Segmental and somatic dysfunction of pelvic region: Secondary | ICD-10-CM | POA: Diagnosis not present

## 2018-04-10 DIAGNOSIS — M5442 Lumbago with sciatica, left side: Secondary | ICD-10-CM | POA: Diagnosis not present

## 2018-04-10 DIAGNOSIS — M9903 Segmental and somatic dysfunction of lumbar region: Secondary | ICD-10-CM | POA: Diagnosis not present

## 2018-04-23 DIAGNOSIS — M47819 Spondylosis without myelopathy or radiculopathy, site unspecified: Secondary | ICD-10-CM | POA: Diagnosis not present

## 2018-04-29 DIAGNOSIS — I2510 Atherosclerotic heart disease of native coronary artery without angina pectoris: Secondary | ICD-10-CM | POA: Diagnosis not present

## 2018-04-29 DIAGNOSIS — E7800 Pure hypercholesterolemia, unspecified: Secondary | ICD-10-CM | POA: Diagnosis not present

## 2018-04-29 DIAGNOSIS — Z9861 Coronary angioplasty status: Secondary | ICD-10-CM | POA: Diagnosis not present

## 2018-06-03 DIAGNOSIS — R05 Cough: Secondary | ICD-10-CM | POA: Diagnosis not present

## 2018-06-03 DIAGNOSIS — E782 Mixed hyperlipidemia: Secondary | ICD-10-CM | POA: Diagnosis not present

## 2018-06-03 DIAGNOSIS — G4733 Obstructive sleep apnea (adult) (pediatric): Secondary | ICD-10-CM | POA: Diagnosis not present

## 2018-06-03 DIAGNOSIS — I2510 Atherosclerotic heart disease of native coronary artery without angina pectoris: Secondary | ICD-10-CM | POA: Diagnosis not present

## 2018-07-09 ENCOUNTER — Other Ambulatory Visit: Payer: Self-pay

## 2018-07-09 MED ORDER — CARVEDILOL 3.125 MG PO TABS: 3.1250 mg | ORAL_TABLET | Freq: Every day | ORAL | 1 refills | Status: DC

## 2018-07-09 MED ORDER — LOSARTAN POTASSIUM 25 MG PO TABS: 12.5000 mg | ORAL_TABLET | Freq: Every day | ORAL | 3 refills | Status: DC

## 2018-07-09 MED ORDER — LOSARTAN POTASSIUM 25 MG PO TABS: 312.5000 mg | ORAL_TABLET | Freq: Every day | ORAL | 1 refills | Status: DC

## 2018-08-02 DIAGNOSIS — J22 Unspecified acute lower respiratory infection: Secondary | ICD-10-CM | POA: Diagnosis not present

## 2018-08-02 DIAGNOSIS — R05 Cough: Secondary | ICD-10-CM | POA: Diagnosis not present

## 2018-08-02 DIAGNOSIS — J301 Allergic rhinitis due to pollen: Secondary | ICD-10-CM | POA: Diagnosis not present

## 2018-08-05 DIAGNOSIS — R05 Cough: Secondary | ICD-10-CM | POA: Diagnosis not present

## 2018-08-16 DIAGNOSIS — I2510 Atherosclerotic heart disease of native coronary artery without angina pectoris: Secondary | ICD-10-CM | POA: Diagnosis not present

## 2018-08-16 DIAGNOSIS — J301 Allergic rhinitis due to pollen: Secondary | ICD-10-CM | POA: Diagnosis not present

## 2018-08-16 DIAGNOSIS — E782 Mixed hyperlipidemia: Secondary | ICD-10-CM | POA: Diagnosis not present

## 2018-11-19 ENCOUNTER — Other Ambulatory Visit: Payer: Self-pay

## 2018-11-19 DIAGNOSIS — E7800 Pure hypercholesterolemia, unspecified: Secondary | ICD-10-CM

## 2018-11-19 MED ORDER — REPATHA SURECLICK 140 MG/ML ~~LOC~~ SOAJ: 140.0000 mL | SUBCUTANEOUS | 3 refills | Status: DC

## 2019-06-06 ENCOUNTER — Other Ambulatory Visit: Payer: Self-pay

## 2019-06-06 DIAGNOSIS — E7800 Pure hypercholesterolemia, unspecified: Secondary | ICD-10-CM

## 2019-06-06 MED ORDER — REPATHA SURECLICK 140 MG/ML ~~LOC~~ SOAJ
140.0000 mL | SUBCUTANEOUS | 3 refills | Status: DC
Start: 2019-06-06 — End: 2019-08-14

## 2019-06-22 NOTE — Progress Notes (Signed)
Primary Physician/Referring:  Merrilee Seashore, MD  Patient ID: Justin Keller, male    DOB: 1970-04-15, 49 y.o.   MRN: [id number]  Chief Complaint  Patient presents with  . Coronary Artery Disease  . Hyperlipidemia   HPI:    Justin Keller  is a 49 y.o. Caucasian male with hyperlipidemia h/o NSTEMI on 03/09/2012 with NSTEMI, totally occluded ramus intermediate coronary artery. He underwent successful angioplasty of the same and during cardiac catheterization was found to have residual distal RCA 70-80% stenosis, FFR negative and now treated medically. He remains asymptomatic.  Past Medical History:  Diagnosis Date  . Coronary artery disease   . Headache(784.0)   . Myocardial infarction ([place])   . Sleep apnea    Past Surgical History:  Procedure Laterality Date  . CORONARY STENT PLACEMENT    . LEFT HEART CATH N/A 03/09/2012   Procedure: LEFT HEART CATH;  Surgeon: Laverda Page, MD;  Location: [facility] CATH LAB;  Service: Cardiovascular;  Laterality: N/A;  . LEFT HEART CATHETERIZATION WITH CORONARY ANGIOGRAM Bilateral 04/02/2012   Procedure: LEFT HEART CATHETERIZATION WITH CORONARY ANGIOGRAM;  Surgeon: Laverda Page, MD;  Location: [facility] CATH LAB;  Service: Cardiovascular;  Laterality: Bilateral;  . PERCUTANEOUS STENT INTERVENTION  03/09/2012   Procedure: PERCUTANEOUS STENT INTERVENTION;  Surgeon: Laverda Page, MD;  Location: [facility] CATH LAB;  Service: Cardiovascular;;   Family History  Problem Relation Age of Onset  . Prostate cancer Father   . Breast cancer Sister     Social History   Tobacco Use  . Smoking status: Never Smoker  Substance Use Topics  . Alcohol use: Yes   ROS  Review of Systems  Cardiovascular: Negative for chest pain, dyspnea on exertion and leg swelling.  Gastrointestinal: Negative for melena.   Objective  Blood pressure 122/80, pulse 76, temperature 98.1 F (36.7 C), height 5' 9"  (1.753 m), weight 192 lb (87.1 kg), SpO2 96 %.  Vitals  with BMI 06/23/2019 02/17/2013 02/17/2013  Height 5' 9"  - -  Weight 192 lbs - -  BMI 82.70 - -  Systolic 786 754 492  Diastolic 80 76 76  Pulse 76 69 67     Physical Exam  Cardiovascular: Normal rate, regular rhythm, normal heart sounds and intact distal pulses. Exam reveals no gallop.  No murmur heard. No leg edema, no JVD.  Pulmonary/Chest: Effort normal and breath sounds normal.  Abdominal: Soft. Bowel sounds are normal.   Laboratory examination:  External labs:   Labs 05/16/2019: HB 14.7/HCT 43.8, platelets 244.  BUN 14, creatinine 1.0, eGFR greater than 760 ML, potassium 4.7.  CMP otherwise normal.  A1c 5.3%.  Total cholesterol 110, triglycerides 172, HDL 38, LDL 43  Medications and allergies  No Known Allergies   Current Outpatient Medications  Medication Instructions  . aspirin 325 mg, Oral, Daily  . carvedilol (COREG) 3.125 mg, Daily  . Evolocumab (REPATHA SURECLICK) 010 MG/ML SOAJ 140 mLs, Subcutaneous, Every 14 days  . losartan (COZAAR) 12.5 mg, Oral, Daily  . Multiple Vitamin (MULTIVITAMIN) tablet 1 tablet, Daily  . nitroGLYCERIN (NITROSTAT) 0.4 mg, Sublingual, Every 5 min x3 PRN   Radiology:   No results found.  Cardiac Studies:   PTCA and stenting 03/09/12: Ramus intermediate branch with 3.0 x 20 mm Promus drug-eluting stent to the ramus intermediate vessel.  Residual 70-80% distal RCA stenosis. Repeat heart cath 04/02/12: RCA stenosis not significant by FFR and appeared 50%.  EKG  EKG 06/26/2019: Normal sinus rhythm with rate  of 70 bpm, normal axis.  Incomplete right bundle branch block.  No evidence of ischemia otherwise normal EKG. NO SIGNIFICANT CHANGE FROM EKG 04/29/2018   Assessment     ICD-10-CM   1. Coronary artery disease involving native coronary artery of native heart without angina pectoris  I25.10 EKG 12-Lead    Evolocumab SOAJ 140 mg  2. Hypercholesteremia  E78.00 Evolocumab SOAJ 140 mg     Meds ordered this encounter  Medications   . Evolocumab SOAJ 140 mg    Medications Discontinued During This Encounter  Medication Reason  . aspirin 81 MG chewable tablet Error  . atorvastatin (LIPITOR) 20 MG tablet Side effect (s)  . carvedilol (COREG) 3.125 MG tablet Error  . loratadine (CLARITIN) 10 MG tablet Error  . losartan (COZAAR) 25 MG tablet Error  . prasugrel (EFFIENT) 10 MG TABS Completed Course  . prasugrel (EFFIENT) 10 MG TABS Completed Course    Recommendations:   Justin Keller  is a 49 y.o. Caucasian male with hyperlipidemia h/o NSTEMI on 03/09/2012 with NSTEMI, totally occluded ramus intermediate coronary artery. He underwent successful angioplasty of the same and during cardiac catheterization was found to have residual distal RCA 70-80% stenosis, FFR negative and now treated medically.  He is presently doing well and essentially remains asymptomatic without recurrence of angina pectoris.  No changes EKG, blood pressure is also well controlled.  I reviewed his external records, his triglycerides have elevated, this is related to his weight gain during COVID-19.  Patient otherwise is fairly active, he has already lost 4 pound since the labs and expect improvement in his lipids.  He has appointment follow-up with his PCP Dr. Ashby Dawes for follow-up of lipid profile.  Patient is willing to be screened for "[facility]" LPA observational study, LPA levels will be drawn today.  Evolocumab SOAJ 140 mg   Administration Action Time Recorded Time Documented By Site Comment Reason Patient Supplied  Given : 140 mg :  : Subcutaneous 06/23/19 1050 06/23/19 1055 [place], [place] Left Anterior Thigh NDC [id number]  EXP 07/263  lot #[id number]  No   Adrian Prows, MD, [facility] 06/23/2019, 10:40 AM [facility] [place] Office: [phone]

## 2019-06-23 ENCOUNTER — Other Ambulatory Visit: Payer: Self-pay

## 2019-06-23 ENCOUNTER — Encounter: Payer: Self-pay | Admitting: Cardiology

## 2019-06-23 ENCOUNTER — Ambulatory Visit: Payer: 59 | Admitting: Cardiology

## 2019-06-23 VITALS — BP 122/80 | HR 76 | Temp 98.1°F | Ht 69.0 in | Wt 192.0 lb

## 2019-06-23 DIAGNOSIS — I2510 Atherosclerotic heart disease of native coronary artery without angina pectoris: Secondary | ICD-10-CM

## 2019-06-23 DIAGNOSIS — E7800 Pure hypercholesterolemia, unspecified: Secondary | ICD-10-CM

## 2019-06-23 MED ORDER — EVOLOCUMAB 140 MG/ML ~~LOC~~ SOAJ
140.0000 mg | Freq: Once | SUBCUTANEOUS | Status: AC
  Administered 2019-06-23: 11:00:00 140 mg via SUBCUTANEOUS

## 2019-08-14 ENCOUNTER — Other Ambulatory Visit: Payer: Self-pay

## 2019-08-14 DIAGNOSIS — E7800 Pure hypercholesterolemia, unspecified: Secondary | ICD-10-CM

## 2019-08-14 MED ORDER — REPATHA SURECLICK 140 MG/ML ~~LOC~~ SOAJ: 140.0000 mL | SUBCUTANEOUS | 3 refills | Status: DC

## 2019-10-10 DIAGNOSIS — J3089 Other allergic rhinitis: Secondary | ICD-10-CM | POA: Diagnosis not present

## 2019-10-10 DIAGNOSIS — J3081 Allergic rhinitis due to animal (cat) (dog) hair and dander: Secondary | ICD-10-CM | POA: Diagnosis not present

## 2019-10-10 DIAGNOSIS — J301 Allergic rhinitis due to pollen: Secondary | ICD-10-CM | POA: Diagnosis not present

## 2019-10-15 DIAGNOSIS — Z20822 Contact with and (suspected) exposure to covid-19: Secondary | ICD-10-CM | POA: Diagnosis not present

## 2019-10-17 DIAGNOSIS — J3081 Allergic rhinitis due to animal (cat) (dog) hair and dander: Secondary | ICD-10-CM | POA: Diagnosis not present

## 2019-10-17 DIAGNOSIS — J3089 Other allergic rhinitis: Secondary | ICD-10-CM | POA: Diagnosis not present

## 2019-10-17 DIAGNOSIS — J301 Allergic rhinitis due to pollen: Secondary | ICD-10-CM | POA: Diagnosis not present

## 2019-10-27 DIAGNOSIS — J301 Allergic rhinitis due to pollen: Secondary | ICD-10-CM | POA: Diagnosis not present

## 2019-10-27 DIAGNOSIS — J3089 Other allergic rhinitis: Secondary | ICD-10-CM | POA: Diagnosis not present

## 2019-10-27 DIAGNOSIS — J3081 Allergic rhinitis due to animal (cat) (dog) hair and dander: Secondary | ICD-10-CM | POA: Diagnosis not present

## 2019-11-04 DIAGNOSIS — J301 Allergic rhinitis due to pollen: Secondary | ICD-10-CM | POA: Diagnosis not present

## 2019-11-04 DIAGNOSIS — J3089 Other allergic rhinitis: Secondary | ICD-10-CM | POA: Diagnosis not present

## 2019-11-04 DIAGNOSIS — J3081 Allergic rhinitis due to animal (cat) (dog) hair and dander: Secondary | ICD-10-CM | POA: Diagnosis not present

## 2019-11-07 DIAGNOSIS — J3081 Allergic rhinitis due to animal (cat) (dog) hair and dander: Secondary | ICD-10-CM | POA: Diagnosis not present

## 2019-11-07 DIAGNOSIS — J3089 Other allergic rhinitis: Secondary | ICD-10-CM | POA: Diagnosis not present

## 2019-11-07 DIAGNOSIS — J301 Allergic rhinitis due to pollen: Secondary | ICD-10-CM | POA: Diagnosis not present

## 2019-11-10 DIAGNOSIS — J3089 Other allergic rhinitis: Secondary | ICD-10-CM | POA: Diagnosis not present

## 2019-11-10 DIAGNOSIS — J301 Allergic rhinitis due to pollen: Secondary | ICD-10-CM | POA: Diagnosis not present

## 2019-11-10 DIAGNOSIS — J3081 Allergic rhinitis due to animal (cat) (dog) hair and dander: Secondary | ICD-10-CM | POA: Diagnosis not present

## 2019-11-17 DIAGNOSIS — J301 Allergic rhinitis due to pollen: Secondary | ICD-10-CM | POA: Diagnosis not present

## 2019-11-17 DIAGNOSIS — J3081 Allergic rhinitis due to animal (cat) (dog) hair and dander: Secondary | ICD-10-CM | POA: Diagnosis not present

## 2019-11-17 DIAGNOSIS — J3089 Other allergic rhinitis: Secondary | ICD-10-CM | POA: Diagnosis not present

## 2019-12-04 DIAGNOSIS — J301 Allergic rhinitis due to pollen: Secondary | ICD-10-CM | POA: Diagnosis not present

## 2019-12-04 DIAGNOSIS — J3089 Other allergic rhinitis: Secondary | ICD-10-CM | POA: Diagnosis not present

## 2019-12-04 DIAGNOSIS — J3081 Allergic rhinitis due to animal (cat) (dog) hair and dander: Secondary | ICD-10-CM | POA: Diagnosis not present

## 2019-12-09 DIAGNOSIS — Z20822 Contact with and (suspected) exposure to covid-19: Secondary | ICD-10-CM | POA: Diagnosis not present

## 2019-12-10 DIAGNOSIS — J3081 Allergic rhinitis due to animal (cat) (dog) hair and dander: Secondary | ICD-10-CM | POA: Diagnosis not present

## 2019-12-10 DIAGNOSIS — J3089 Other allergic rhinitis: Secondary | ICD-10-CM | POA: Diagnosis not present

## 2019-12-10 DIAGNOSIS — J301 Allergic rhinitis due to pollen: Secondary | ICD-10-CM | POA: Diagnosis not present

## 2020-01-02 DIAGNOSIS — J3081 Allergic rhinitis due to animal (cat) (dog) hair and dander: Secondary | ICD-10-CM | POA: Diagnosis not present

## 2020-01-02 DIAGNOSIS — J3089 Other allergic rhinitis: Secondary | ICD-10-CM | POA: Diagnosis not present

## 2020-01-02 DIAGNOSIS — J301 Allergic rhinitis due to pollen: Secondary | ICD-10-CM | POA: Diagnosis not present

## 2020-01-06 DIAGNOSIS — J301 Allergic rhinitis due to pollen: Secondary | ICD-10-CM | POA: Diagnosis not present

## 2020-01-06 DIAGNOSIS — J3081 Allergic rhinitis due to animal (cat) (dog) hair and dander: Secondary | ICD-10-CM | POA: Diagnosis not present

## 2020-01-06 DIAGNOSIS — J3089 Other allergic rhinitis: Secondary | ICD-10-CM | POA: Diagnosis not present

## 2020-01-21 DIAGNOSIS — J301 Allergic rhinitis due to pollen: Secondary | ICD-10-CM | POA: Diagnosis not present

## 2020-01-21 DIAGNOSIS — J3081 Allergic rhinitis due to animal (cat) (dog) hair and dander: Secondary | ICD-10-CM | POA: Diagnosis not present

## 2020-01-21 DIAGNOSIS — J3089 Other allergic rhinitis: Secondary | ICD-10-CM | POA: Diagnosis not present

## 2020-01-23 DIAGNOSIS — J301 Allergic rhinitis due to pollen: Secondary | ICD-10-CM | POA: Diagnosis not present

## 2020-01-23 DIAGNOSIS — J3089 Other allergic rhinitis: Secondary | ICD-10-CM | POA: Diagnosis not present

## 2020-01-23 DIAGNOSIS — H1045 Other chronic allergic conjunctivitis: Secondary | ICD-10-CM | POA: Diagnosis not present

## 2020-01-23 DIAGNOSIS — J3081 Allergic rhinitis due to animal (cat) (dog) hair and dander: Secondary | ICD-10-CM | POA: Diagnosis not present

## 2020-01-30 DIAGNOSIS — J3089 Other allergic rhinitis: Secondary | ICD-10-CM | POA: Diagnosis not present

## 2020-01-30 DIAGNOSIS — J301 Allergic rhinitis due to pollen: Secondary | ICD-10-CM | POA: Diagnosis not present

## 2020-01-30 DIAGNOSIS — J3081 Allergic rhinitis due to animal (cat) (dog) hair and dander: Secondary | ICD-10-CM | POA: Diagnosis not present

## 2020-01-31 DIAGNOSIS — Z20822 Contact with and (suspected) exposure to covid-19: Secondary | ICD-10-CM | POA: Diagnosis not present

## 2020-02-09 DIAGNOSIS — J301 Allergic rhinitis due to pollen: Secondary | ICD-10-CM | POA: Diagnosis not present

## 2020-02-09 DIAGNOSIS — J3081 Allergic rhinitis due to animal (cat) (dog) hair and dander: Secondary | ICD-10-CM | POA: Diagnosis not present

## 2020-02-09 DIAGNOSIS — J3089 Other allergic rhinitis: Secondary | ICD-10-CM | POA: Diagnosis not present

## 2020-03-09 DIAGNOSIS — J3089 Other allergic rhinitis: Secondary | ICD-10-CM | POA: Diagnosis not present

## 2020-03-09 DIAGNOSIS — J301 Allergic rhinitis due to pollen: Secondary | ICD-10-CM | POA: Diagnosis not present

## 2020-03-09 DIAGNOSIS — J3081 Allergic rhinitis due to animal (cat) (dog) hair and dander: Secondary | ICD-10-CM | POA: Diagnosis not present

## 2020-04-07 DIAGNOSIS — J3081 Allergic rhinitis due to animal (cat) (dog) hair and dander: Secondary | ICD-10-CM | POA: Diagnosis not present

## 2020-04-07 DIAGNOSIS — J3089 Other allergic rhinitis: Secondary | ICD-10-CM | POA: Diagnosis not present

## 2020-04-07 DIAGNOSIS — J301 Allergic rhinitis due to pollen: Secondary | ICD-10-CM | POA: Diagnosis not present

## 2020-06-07 DIAGNOSIS — J3081 Allergic rhinitis due to animal (cat) (dog) hair and dander: Secondary | ICD-10-CM | POA: Diagnosis not present

## 2020-06-07 DIAGNOSIS — J301 Allergic rhinitis due to pollen: Secondary | ICD-10-CM | POA: Diagnosis not present

## 2020-06-07 DIAGNOSIS — J3089 Other allergic rhinitis: Secondary | ICD-10-CM | POA: Diagnosis not present

## 2020-06-14 DIAGNOSIS — J3081 Allergic rhinitis due to animal (cat) (dog) hair and dander: Secondary | ICD-10-CM | POA: Diagnosis not present

## 2020-06-14 DIAGNOSIS — J3089 Other allergic rhinitis: Secondary | ICD-10-CM | POA: Diagnosis not present

## 2020-06-14 DIAGNOSIS — J301 Allergic rhinitis due to pollen: Secondary | ICD-10-CM | POA: Diagnosis not present

## 2020-06-18 ENCOUNTER — Ambulatory Visit: Payer: BC Managed Care – PPO | Admitting: Cardiology

## 2020-06-18 DIAGNOSIS — I2510 Atherosclerotic heart disease of native coronary artery without angina pectoris: Secondary | ICD-10-CM | POA: Diagnosis not present

## 2020-06-18 DIAGNOSIS — E7800 Pure hypercholesterolemia, unspecified: Secondary | ICD-10-CM | POA: Diagnosis not present

## 2020-06-18 NOTE — Progress Notes (Deleted)
No Show

## 2020-06-22 DIAGNOSIS — J3089 Other allergic rhinitis: Secondary | ICD-10-CM | POA: Diagnosis not present

## 2020-06-22 DIAGNOSIS — J301 Allergic rhinitis due to pollen: Secondary | ICD-10-CM | POA: Diagnosis not present

## 2020-06-22 DIAGNOSIS — J3081 Allergic rhinitis due to animal (cat) (dog) hair and dander: Secondary | ICD-10-CM | POA: Diagnosis not present

## 2020-06-29 DIAGNOSIS — J3089 Other allergic rhinitis: Secondary | ICD-10-CM | POA: Diagnosis not present

## 2020-06-29 DIAGNOSIS — J301 Allergic rhinitis due to pollen: Secondary | ICD-10-CM | POA: Diagnosis not present

## 2020-06-29 DIAGNOSIS — J3081 Allergic rhinitis due to animal (cat) (dog) hair and dander: Secondary | ICD-10-CM | POA: Diagnosis not present

## 2020-07-09 DIAGNOSIS — J3089 Other allergic rhinitis: Secondary | ICD-10-CM | POA: Diagnosis not present

## 2020-07-09 DIAGNOSIS — J3081 Allergic rhinitis due to animal (cat) (dog) hair and dander: Secondary | ICD-10-CM | POA: Diagnosis not present

## 2020-07-09 DIAGNOSIS — J301 Allergic rhinitis due to pollen: Secondary | ICD-10-CM | POA: Diagnosis not present

## 2020-07-23 DIAGNOSIS — J3081 Allergic rhinitis due to animal (cat) (dog) hair and dander: Secondary | ICD-10-CM | POA: Diagnosis not present

## 2020-07-23 DIAGNOSIS — J301 Allergic rhinitis due to pollen: Secondary | ICD-10-CM | POA: Diagnosis not present

## 2020-07-23 DIAGNOSIS — J3089 Other allergic rhinitis: Secondary | ICD-10-CM | POA: Diagnosis not present

## 2020-07-26 ENCOUNTER — Ambulatory Visit: Payer: BC Managed Care – PPO | Admitting: Cardiology

## 2020-07-30 DIAGNOSIS — J3089 Other allergic rhinitis: Secondary | ICD-10-CM | POA: Diagnosis not present

## 2020-07-30 DIAGNOSIS — J3081 Allergic rhinitis due to animal (cat) (dog) hair and dander: Secondary | ICD-10-CM | POA: Diagnosis not present

## 2020-07-30 DIAGNOSIS — J301 Allergic rhinitis due to pollen: Secondary | ICD-10-CM | POA: Diagnosis not present

## 2020-08-04 DIAGNOSIS — J3081 Allergic rhinitis due to animal (cat) (dog) hair and dander: Secondary | ICD-10-CM | POA: Diagnosis not present

## 2020-08-04 DIAGNOSIS — J301 Allergic rhinitis due to pollen: Secondary | ICD-10-CM | POA: Diagnosis not present

## 2020-08-04 DIAGNOSIS — J3089 Other allergic rhinitis: Secondary | ICD-10-CM | POA: Diagnosis not present

## 2020-08-18 DIAGNOSIS — J3089 Other allergic rhinitis: Secondary | ICD-10-CM | POA: Diagnosis not present

## 2020-08-18 DIAGNOSIS — J3081 Allergic rhinitis due to animal (cat) (dog) hair and dander: Secondary | ICD-10-CM | POA: Diagnosis not present

## 2020-08-18 DIAGNOSIS — J301 Allergic rhinitis due to pollen: Secondary | ICD-10-CM | POA: Diagnosis not present

## 2020-08-25 DIAGNOSIS — J301 Allergic rhinitis due to pollen: Secondary | ICD-10-CM | POA: Diagnosis not present

## 2020-08-25 DIAGNOSIS — J3081 Allergic rhinitis due to animal (cat) (dog) hair and dander: Secondary | ICD-10-CM | POA: Diagnosis not present

## 2020-08-25 DIAGNOSIS — J3089 Other allergic rhinitis: Secondary | ICD-10-CM | POA: Diagnosis not present

## 2020-09-09 DIAGNOSIS — J301 Allergic rhinitis due to pollen: Secondary | ICD-10-CM | POA: Diagnosis not present

## 2020-09-09 DIAGNOSIS — J3089 Other allergic rhinitis: Secondary | ICD-10-CM | POA: Diagnosis not present

## 2020-09-09 DIAGNOSIS — J3081 Allergic rhinitis due to animal (cat) (dog) hair and dander: Secondary | ICD-10-CM | POA: Diagnosis not present

## 2020-09-10 DIAGNOSIS — J301 Allergic rhinitis due to pollen: Secondary | ICD-10-CM | POA: Diagnosis not present

## 2020-09-13 DIAGNOSIS — J3089 Other allergic rhinitis: Secondary | ICD-10-CM | POA: Diagnosis not present

## 2020-09-13 DIAGNOSIS — J3081 Allergic rhinitis due to animal (cat) (dog) hair and dander: Secondary | ICD-10-CM | POA: Diagnosis not present

## 2020-09-13 DIAGNOSIS — J301 Allergic rhinitis due to pollen: Secondary | ICD-10-CM | POA: Diagnosis not present

## 2020-10-19 ENCOUNTER — Other Ambulatory Visit: Payer: Self-pay | Admitting: Cardiology

## 2020-10-19 DIAGNOSIS — E7800 Pure hypercholesterolemia, unspecified: Secondary | ICD-10-CM

## 2020-11-01 DIAGNOSIS — J3081 Allergic rhinitis due to animal (cat) (dog) hair and dander: Secondary | ICD-10-CM | POA: Diagnosis not present

## 2020-11-01 DIAGNOSIS — J301 Allergic rhinitis due to pollen: Secondary | ICD-10-CM | POA: Diagnosis not present

## 2020-11-01 DIAGNOSIS — J3089 Other allergic rhinitis: Secondary | ICD-10-CM | POA: Diagnosis not present

## 2020-11-01 DIAGNOSIS — H1045 Other chronic allergic conjunctivitis: Secondary | ICD-10-CM | POA: Diagnosis not present

## 2020-11-05 DIAGNOSIS — J3081 Allergic rhinitis due to animal (cat) (dog) hair and dander: Secondary | ICD-10-CM | POA: Diagnosis not present

## 2020-11-05 DIAGNOSIS — J301 Allergic rhinitis due to pollen: Secondary | ICD-10-CM | POA: Diagnosis not present

## 2020-11-05 DIAGNOSIS — J3089 Other allergic rhinitis: Secondary | ICD-10-CM | POA: Diagnosis not present

## 2020-11-22 DIAGNOSIS — J3089 Other allergic rhinitis: Secondary | ICD-10-CM | POA: Diagnosis not present

## 2020-11-22 DIAGNOSIS — J3081 Allergic rhinitis due to animal (cat) (dog) hair and dander: Secondary | ICD-10-CM | POA: Diagnosis not present

## 2020-11-22 DIAGNOSIS — J301 Allergic rhinitis due to pollen: Secondary | ICD-10-CM | POA: Diagnosis not present

## 2020-11-29 DIAGNOSIS — J3089 Other allergic rhinitis: Secondary | ICD-10-CM | POA: Diagnosis not present

## 2020-11-29 DIAGNOSIS — J3081 Allergic rhinitis due to animal (cat) (dog) hair and dander: Secondary | ICD-10-CM | POA: Diagnosis not present

## 2020-11-29 DIAGNOSIS — J301 Allergic rhinitis due to pollen: Secondary | ICD-10-CM | POA: Diagnosis not present

## 2020-12-03 DIAGNOSIS — J301 Allergic rhinitis due to pollen: Secondary | ICD-10-CM | POA: Diagnosis not present

## 2020-12-03 DIAGNOSIS — J3089 Other allergic rhinitis: Secondary | ICD-10-CM | POA: Diagnosis not present

## 2020-12-03 DIAGNOSIS — J3081 Allergic rhinitis due to animal (cat) (dog) hair and dander: Secondary | ICD-10-CM | POA: Diagnosis not present

## 2020-12-07 DIAGNOSIS — J3089 Other allergic rhinitis: Secondary | ICD-10-CM | POA: Diagnosis not present

## 2020-12-07 DIAGNOSIS — J3081 Allergic rhinitis due to animal (cat) (dog) hair and dander: Secondary | ICD-10-CM | POA: Diagnosis not present

## 2020-12-07 DIAGNOSIS — J301 Allergic rhinitis due to pollen: Secondary | ICD-10-CM | POA: Diagnosis not present

## 2020-12-15 DIAGNOSIS — J301 Allergic rhinitis due to pollen: Secondary | ICD-10-CM | POA: Diagnosis not present

## 2020-12-15 DIAGNOSIS — J3089 Other allergic rhinitis: Secondary | ICD-10-CM | POA: Diagnosis not present

## 2020-12-15 DIAGNOSIS — J3081 Allergic rhinitis due to animal (cat) (dog) hair and dander: Secondary | ICD-10-CM | POA: Diagnosis not present

## 2020-12-30 DIAGNOSIS — J3081 Allergic rhinitis due to animal (cat) (dog) hair and dander: Secondary | ICD-10-CM | POA: Diagnosis not present

## 2020-12-30 DIAGNOSIS — J3089 Other allergic rhinitis: Secondary | ICD-10-CM | POA: Diagnosis not present

## 2020-12-30 DIAGNOSIS — J301 Allergic rhinitis due to pollen: Secondary | ICD-10-CM | POA: Diagnosis not present

## 2021-01-04 DIAGNOSIS — J3081 Allergic rhinitis due to animal (cat) (dog) hair and dander: Secondary | ICD-10-CM | POA: Diagnosis not present

## 2021-01-04 DIAGNOSIS — J3089 Other allergic rhinitis: Secondary | ICD-10-CM | POA: Diagnosis not present

## 2021-01-04 DIAGNOSIS — J301 Allergic rhinitis due to pollen: Secondary | ICD-10-CM | POA: Diagnosis not present

## 2021-01-09 NOTE — Progress Notes (Addendum)
No show

## 2021-01-11 DIAGNOSIS — J301 Allergic rhinitis due to pollen: Secondary | ICD-10-CM | POA: Diagnosis not present

## 2021-01-11 DIAGNOSIS — J3089 Other allergic rhinitis: Secondary | ICD-10-CM | POA: Diagnosis not present

## 2021-01-11 DIAGNOSIS — J3081 Allergic rhinitis due to animal (cat) (dog) hair and dander: Secondary | ICD-10-CM | POA: Diagnosis not present

## 2021-01-17 DIAGNOSIS — J301 Allergic rhinitis due to pollen: Secondary | ICD-10-CM | POA: Diagnosis not present

## 2021-01-17 DIAGNOSIS — J3089 Other allergic rhinitis: Secondary | ICD-10-CM | POA: Diagnosis not present

## 2021-01-17 DIAGNOSIS — J3081 Allergic rhinitis due to animal (cat) (dog) hair and dander: Secondary | ICD-10-CM | POA: Diagnosis not present

## 2021-01-31 DIAGNOSIS — J3089 Other allergic rhinitis: Secondary | ICD-10-CM | POA: Diagnosis not present

## 2021-01-31 DIAGNOSIS — J301 Allergic rhinitis due to pollen: Secondary | ICD-10-CM | POA: Diagnosis not present

## 2021-01-31 DIAGNOSIS — J3081 Allergic rhinitis due to animal (cat) (dog) hair and dander: Secondary | ICD-10-CM | POA: Diagnosis not present

## 2021-02-11 DIAGNOSIS — J3081 Allergic rhinitis due to animal (cat) (dog) hair and dander: Secondary | ICD-10-CM | POA: Diagnosis not present

## 2021-02-11 DIAGNOSIS — J301 Allergic rhinitis due to pollen: Secondary | ICD-10-CM | POA: Diagnosis not present

## 2021-02-11 DIAGNOSIS — J3089 Other allergic rhinitis: Secondary | ICD-10-CM | POA: Diagnosis not present

## 2021-02-28 DIAGNOSIS — J3081 Allergic rhinitis due to animal (cat) (dog) hair and dander: Secondary | ICD-10-CM | POA: Diagnosis not present

## 2021-02-28 DIAGNOSIS — J301 Allergic rhinitis due to pollen: Secondary | ICD-10-CM | POA: Diagnosis not present

## 2021-02-28 DIAGNOSIS — J3089 Other allergic rhinitis: Secondary | ICD-10-CM | POA: Diagnosis not present

## 2021-05-05 ENCOUNTER — Other Ambulatory Visit: Payer: Self-pay | Admitting: Cardiology

## 2021-05-05 DIAGNOSIS — E7800 Pure hypercholesterolemia, unspecified: Secondary | ICD-10-CM

## 2021-09-05 DIAGNOSIS — L259 Unspecified contact dermatitis, unspecified cause: Secondary | ICD-10-CM | POA: Diagnosis not present

## 2021-09-05 DIAGNOSIS — E782 Mixed hyperlipidemia: Secondary | ICD-10-CM | POA: Diagnosis not present

## 2021-09-05 DIAGNOSIS — Z20822 Contact with and (suspected) exposure to covid-19: Secondary | ICD-10-CM | POA: Diagnosis not present

## 2021-09-05 DIAGNOSIS — J020 Streptococcal pharyngitis: Secondary | ICD-10-CM | POA: Diagnosis not present

## 2021-09-05 DIAGNOSIS — I2510 Atherosclerotic heart disease of native coronary artery without angina pectoris: Secondary | ICD-10-CM | POA: Diagnosis not present

## 2021-09-19 DIAGNOSIS — M25512 Pain in left shoulder: Secondary | ICD-10-CM | POA: Diagnosis not present

## 2021-09-19 DIAGNOSIS — H6992 Unspecified Eustachian tube disorder, left ear: Secondary | ICD-10-CM | POA: Diagnosis not present

## 2021-09-19 DIAGNOSIS — S46002A Unspecified injury of muscle(s) and tendon(s) of the rotator cuff of left shoulder, initial encounter: Secondary | ICD-10-CM | POA: Diagnosis not present

## 2021-09-20 ENCOUNTER — Other Ambulatory Visit: Payer: Self-pay | Admitting: Cardiology

## 2021-09-20 DIAGNOSIS — E7800 Pure hypercholesterolemia, unspecified: Secondary | ICD-10-CM

## 2021-09-26 ENCOUNTER — Other Ambulatory Visit: Payer: Self-pay | Admitting: Cardiology

## 2021-09-26 ENCOUNTER — Other Ambulatory Visit: Payer: Self-pay

## 2021-09-26 MED ORDER — LOSARTAN POTASSIUM 25 MG PO TABS: 12.5000 mg | ORAL_TABLET | Freq: Every day | ORAL | 0 refills | Status: DC

## 2021-09-26 MED ORDER — CARVEDILOL 3.125 MG PO TABS: 3.1250 mg | ORAL_TABLET | Freq: Every day | ORAL | 0 refills | Status: DC

## 2021-10-24 DIAGNOSIS — Z0000 Encounter for general adult medical examination without abnormal findings: Secondary | ICD-10-CM | POA: Diagnosis not present

## 2021-10-24 DIAGNOSIS — Z125 Encounter for screening for malignant neoplasm of prostate: Secondary | ICD-10-CM | POA: Diagnosis not present

## 2021-11-03 ENCOUNTER — Encounter: Payer: Self-pay | Admitting: Cardiology

## 2021-11-03 ENCOUNTER — Ambulatory Visit: Payer: BC Managed Care – PPO | Admitting: Cardiology

## 2021-11-03 VITALS — BP 148/69 | HR 78 | Temp 98.1°F | Resp 16 | Ht 69.0 in | Wt 193.2 lb

## 2021-11-03 DIAGNOSIS — E7800 Pure hypercholesterolemia, unspecified: Secondary | ICD-10-CM

## 2021-11-03 DIAGNOSIS — I10 Essential (primary) hypertension: Secondary | ICD-10-CM | POA: Diagnosis not present

## 2021-11-03 DIAGNOSIS — I2510 Atherosclerotic heart disease of native coronary artery without angina pectoris: Secondary | ICD-10-CM

## 2021-11-03 DIAGNOSIS — G4733 Obstructive sleep apnea (adult) (pediatric): Secondary | ICD-10-CM | POA: Diagnosis not present

## 2021-11-03 MED ORDER — REPATHA SURECLICK 140 MG/ML ~~LOC~~ SOAJ: SUBCUTANEOUS | 3 refills | Status: DC

## 2021-11-03 MED ORDER — LOSARTAN POTASSIUM 50 MG PO TABS: 50.0000 mg | ORAL_TABLET | Freq: Every evening | ORAL | 3 refills | Status: DC

## 2021-11-03 MED ORDER — CARVEDILOL 6.25 MG PO TABS: 6.2500 mg | ORAL_TABLET | Freq: Two times a day (BID) | ORAL | 3 refills | Status: DC

## 2021-11-03 NOTE — Progress Notes (Signed)
Primary Physician/Referring:  Merrilee Seashore, MD  Patient ID: Justin Keller, male    DOB: 1971-02-26, 51 y.o.   MRN: [id number]  Chief Complaint  Patient presents with   Coronary Artery Disease   Hyperlipidemia   Follow-up    1 Year   HPI:    Justin Keller  is a 51 y.o. Caucasian male with hyperlipidemia h/o NSTEMI on  03/09/2012 with NSTEMI, totally occluded ramus intermediate coronary artery.  He underwent successful angioplasty of the same and during cardiac catheterization was found to have residual distal RCA 70-80% stenosis, FFR negative and now treated medically.    He had been lost to follow-up for almost 2-1/2 years now.  He has noticed elevated blood pressure and also marked fatigue and has gained about 5 pounds in weight.  He was previously diagnosed with sleep apnea but never used CPAP.    Past Medical History:  Diagnosis Date   Coronary artery disease    Headache(784.0)    Hypertension    Myocardial infarction [facility])    Sleep apnea    Past Surgical History:  Procedure Laterality Date   CORONARY STENT PLACEMENT     LEFT HEART CATH N/A 03/09/2012   Procedure: LEFT HEART CATH;  Surgeon: Laverda Page, MD;  Location: [facility] CATH LAB;  Service: Cardiovascular;  Laterality: N/A;   LEFT HEART CATHETERIZATION WITH CORONARY ANGIOGRAM Bilateral 04/02/2012   Procedure: LEFT HEART CATHETERIZATION WITH CORONARY ANGIOGRAM;  Surgeon: Laverda Page, MD;  Location: [facility] CATH LAB;  Service: Cardiovascular;  Laterality: Bilateral;   PERCUTANEOUS STENT INTERVENTION  03/09/2012   Procedure: PERCUTANEOUS STENT INTERVENTION;  Surgeon: Laverda Page, MD;  Location: [facility] CATH LAB;  Service: Cardiovascular;;   Family History  Problem Relation Age of Onset   Prostate cancer Father    Breast cancer Sister     Social History   Tobacco Use   Smoking status: Never   Smokeless tobacco: Never  Substance Use Topics   Alcohol use: Yes    Comment: occasionally   Marital  Status: Legally Separated  ROS  Review of Systems  Cardiovascular:  Negative for chest pain, dyspnea on exertion and leg swelling.  Respiratory:  Positive for snoring.    Objective  Blood pressure (!) 148/69, pulse 78, temperature 98.1 F (36.7 C), temperature source Temporal, resp. rate 16, height 5' 9"  (1.753 m), weight 193 lb 3.2 oz (87.6 kg), SpO2 97 %. Body mass index is 28.53 kg/m.     11/03/2021    9:56 AM 06/23/2019   10:10 AM 02/17/2013    3:15 PM  Vitals with BMI  Height 5' 9"  5' 9"    Weight 193 lbs 3 oz 192 lbs   BMI 14.48 18.56   Systolic 314 970 263  Diastolic 69 80 76  Pulse 78 76 69    Physical Exam Neck:     Vascular: No carotid bruit or JVD.  Cardiovascular:     Rate and Rhythm: Normal rate and regular rhythm.     Pulses: Intact distal pulses.     Heart sounds: Normal heart sounds. No murmur heard.    No gallop.  Pulmonary:     Effort: Pulmonary effort is normal.     Breath sounds: Normal breath sounds.  Abdominal:     General: Bowel sounds are normal.     Palpations: Abdomen is soft.  Musculoskeletal:     Right lower leg: No edema.     Left lower leg: No edema.  Medications and allergies  No Known Allergies   Medication list after today's encounter   Current Outpatient Medications:    aspirin 325 MG tablet, Take 325 mg by mouth daily., Disp: , Rfl:    Multiple Vitamin (MULTIVITAMIN) tablet, Take 1 tablet by mouth daily., Disp: , Rfl:    nitroGLYCERIN (NITROSTAT) 0.4 MG SL tablet, Place 1 tablet (0.4 mg total) under the tongue every 5 (five) minutes x 3 doses as needed for chest pain., Disp: 30 tablet, Rfl: 2   carvedilol (COREG) 6.25 MG tablet, Take 1 tablet (6.25 mg total) by mouth 2 (two) times daily with a meal., Disp: 180 tablet, Rfl: 3   Evolocumab (REPATHA SURECLICK) 381 MG/ML SOAJ, ADMINISTER 1 ML UNDER THE SKIN INTO SKIN EVERY 14 DAYS, Disp: 6 mL, Rfl: 3   losartan (COZAAR) 50 MG tablet, Take 1 tablet (50 mg total) by mouth every  evening., Disp: 90 tablet, Rfl: 3  Laboratory examination:   Lab Results  Component Value Date   NA 139 02/17/2013   K 4.1 02/17/2013   CO2 19 02/17/2013   GLUCOSE 94 02/17/2013   BUN 20 02/17/2013   CREATININE 1.00 02/17/2013   CALCIUM 9.2 02/17/2013   GFRNONAA >90 02/17/2013       Latest Ref Rng & Units 02/17/2013    1:49 PM 02/17/2013   12:18 PM 03/10/2012    6:05 AM  CMP  Glucose 70 - 99 mg/dL 94  90  99   BUN 6 - 23 mg/dL 20  16  11    Creatinine 0.50 - 1.35 mg/dL 1.00  0.76  0.89   Sodium 135 - 145 mEq/L 139  133  140   Potassium 3.5 - 5.1 mEq/L 4.1  5.8  3.6   Chloride 96 - 112 mEq/L 103  100  105   CO2 19 - 32 mEq/L  19  24   Calcium 8.4 - 10.5 mg/dL  9.2  8.8       Latest Ref Rng & Units 02/17/2013    1:49 PM 02/17/2013   12:18 PM 03/10/2012    6:05 AM  CBC  WBC 4.0 - 10.5 K/uL  12.2  8.7   Hemoglobin 13.0 - 17.0 g/dL 15.3  15.6  13.3   Hematocrit 39.0 - 52.0 % 45.0  43.5  38.9   Platelets 150 - 400 K/uL  245  182     Lipid Panel No results for input(s): "CHOL", "TRIG", "LDLCALC", "VLDL", "HDL", "CHOLHDL", "LDLDIRECT" in the last 8760 hours.  HEMOGLOBIN A1C Lab Results  Component Value Date   HGBA1C 5.2 03/09/2012   MPG 103 03/09/2012   TSH No results for input(s): "TSH" in the last 8760 hours.  External labs:   Labs 05/16/2019 Hb 14.7/HCT 43.8, platelets 244.  BUN 14, creatinine 1.0, EGFR 81 mL.  A1c 5.3%.  Total cholesterol 110, triglycerides 172, HDL 38, LDL 43.  Radiology:    Cardiac Studies:   PTCA and stenting 03/09/12:  [place] intermediate branch with 3.0 x 20 mm Promus drug-eluting stent to the ramus intermediate vessel.  Residual 70-80% distal RCA stenosis. Repeat heart cath 04/02/12: RCA stenosis not significant by FFR and appeared 50%.  EKG:   EKG 11/03/2021: Normal sinus rhythm at rate of 67 bpm, incomplete right bundle branch block otherwise normal EKG. no change from 06/26/2019.   Assessment     ICD-10-CM   1. Coronary  artery disease involving native coronary artery of native heart without angina pectoris  I25.10 EKG 12-Lead  carvedilol (COREG) 6.25 MG tablet    2. Hypercholesteremia  E78.00     3. Primary hypertension  I10 losartan (COZAAR) 50 MG tablet    4. Obstructive sleep apnea  G47.33 Ambulatory referral to Sleep Studies    5. Pure hypercholesterolemia  E78.00 Evolocumab (REPATHA SURECLICK) 742 MG/ML SOAJ       Orders Placed This Encounter  Procedures   Ambulatory referral to Sleep Studies    Referral Priority:   Routine    Referral Type:   Consultation    Referral Reason:   Specialty Services Required    Number of Visits Requested:   1   EKG 12-Lead    Meds ordered this encounter  Medications   carvedilol (COREG) 6.25 MG tablet    Sig: Take 1 tablet (6.25 mg total) by mouth 2 (two) times daily with a meal.    Dispense:  180 tablet    Refill:  3    **Patient requests 90 days supply**   losartan (COZAAR) 50 MG tablet    Sig: Take 1 tablet (50 mg total) by mouth every evening.    Dispense:  90 tablet    Refill:  3   Evolocumab (REPATHA SURECLICK) 595 MG/ML SOAJ    Sig: ADMINISTER 1 ML UNDER THE SKIN INTO SKIN EVERY 14 DAYS    Dispense:  6 mL    Refill:  3    Medications Discontinued During This Encounter  Medication Reason   REPATHA SURECLICK 638 MG/ML SOAJ Reorder   losartan (COZAAR) 25 MG tablet Reorder   carvedilol (COREG) 3.125 MG tablet Reorder     Recommendations:   Justin Keller is a 51 y.o.  Caucasian male with hyperlipidemia h/o NSTEMI on  03/09/2012 with NSTEMI, totally occluded ramus intermediate coronary artery.  He underwent successful angioplasty of the same and during cardiac catheterization was found to have residual distal RCA 70-80% stenosis, FFR negative and now treated medically.    He had been lost to follow-up for almost 2-1/2 years now.  He has noticed elevated blood pressure and also marked fatigue and has gained about 5 pounds in weight.  He was  previously diagnosed with sleep apnea but never used CPAP.  Suspect his elevated blood pressure is related to sleep apnea and also weight gain.  Although he has not had any cardiac work-up for most 10 years, he remains asymptomatic, yesterday he played 5 sets of tennis without any dyspnea or chest pain.  Hence do not think he needs any further cardiac work-up at this time.  I will refer him for sleep study.  For hypertension we will increase his losartan from 25 mg to 50 mg in the evening and increase carvedilol from 3.187m to 6.25 mg twice daily.  I would like to see him back in 6 weeks for follow-up.  He has had recent labs by his PCP, I will obtain this from her records. 40 min encounter regarding compliance, reconciliation of records and counseling.    JAdrian Prows MD, [facility]8/06/2021, 10:57 AM Office: [phone]

## 2021-11-11 ENCOUNTER — Other Ambulatory Visit: Payer: Self-pay

## 2021-11-25 ENCOUNTER — Telehealth: Payer: Self-pay

## 2021-11-25 ENCOUNTER — Other Ambulatory Visit: Payer: Self-pay

## 2021-11-25 DIAGNOSIS — E7800 Pure hypercholesterolemia, unspecified: Secondary | ICD-10-CM

## 2021-11-25 MED ORDER — REPATHA SURECLICK 140 MG/ML ~~LOC~~ SOAJ: SUBCUTANEOUS | 3 refills | Status: DC

## 2021-11-25 NOTE — Telephone Encounter (Signed)
Refill request

## 2021-12-06 DIAGNOSIS — M549 Dorsalgia, unspecified: Secondary | ICD-10-CM | POA: Diagnosis not present

## 2021-12-08 DIAGNOSIS — M545 Low back pain, unspecified: Secondary | ICD-10-CM | POA: Diagnosis not present

## 2021-12-15 ENCOUNTER — Ambulatory Visit: Payer: BC Managed Care – PPO | Admitting: Cardiology

## 2021-12-15 ENCOUNTER — Encounter: Payer: Self-pay | Admitting: Cardiology

## 2021-12-15 VITALS — BP 138/87 | HR 63 | Temp 97.3°F | Resp 16 | Ht 69.0 in | Wt 196.8 lb

## 2021-12-15 DIAGNOSIS — E7800 Pure hypercholesterolemia, unspecified: Secondary | ICD-10-CM

## 2021-12-15 DIAGNOSIS — I2510 Atherosclerotic heart disease of native coronary artery without angina pectoris: Secondary | ICD-10-CM

## 2021-12-15 DIAGNOSIS — I10 Essential (primary) hypertension: Secondary | ICD-10-CM | POA: Diagnosis not present

## 2021-12-15 MED ORDER — LOSARTAN POTASSIUM 50 MG PO TABS: 50.0000 mg | ORAL_TABLET | Freq: Every evening | ORAL | 3 refills | Status: DC

## 2021-12-15 NOTE — Progress Notes (Signed)
Primary Physician/Referring:  Merrilee Seashore, MD  Patient ID: Justin Keller, male    DOB: 28-Dec-1970, 51 y.o.   MRN: [id number]  Chief Complaint  Patient presents with   Coronary Artery Disease   Hypertension   Follow-up    6 weeks   HPI:    Justin Keller  is a 51 y.o. Caucasian male with hyperlipidemia h/o NSTEMI on  03/09/2012 with NSTEMI, totally occluded ramus intermediate coronary artery.  He underwent successful angioplasty of the same and during cardiac catheterization was found to have residual distal RCA 70-80% stenosis, FFR negative and now treated medically.    He had been lost to follow-up for almost 2-1/2 years now.  He has noticed elevated blood pressure and also marked fatigue and has gained [place] male with hyperlipidemia h/o NSTEMI on  03/09/2012 with NSTEMI, totally occluded ramus intermediate coronary artery.  He underwent successful angioplasty of the same and during cardiac catheterization was found to have residual distal RCA 70-80% stenosis, FFR negative and now treated medically.  I seen him 6 weeks ago, presents for follow-up of hypertension and CAD.  No chest pain  Past Medical History:  Diagnosis Date   Coronary artery disease    Headache(784.0)    Hypertension    Myocardial infarction [facility])    Sleep apnea    Past Surgical History:  Procedure Laterality Date   CORONARY STENT PLACEMENT     LEFT HEART CATH N/A 03/09/2012   Procedure: LEFT HEART CATH;  Surgeon: Laverda Page, MD;  Location: [facility] CATH LAB;  Service: Cardiovascular;  Laterality: N/A;   LEFT HEART CATHETERIZATION WITH CORONARY ANGIOGRAM Bilateral 04/02/2012   Procedure: LEFT HEART CATHETERIZATION WITH CORONARY ANGIOGRAM;  Surgeon: Laverda Page, MD;  Location: [facility] CATH LAB;  Service: Cardiovascular;  Laterality: Bilateral;   PERCUTANEOUS STENT INTERVENTION  03/09/2012   Procedure: PERCUTANEOUS STENT INTERVENTION;  Surgeon: Laverda Page, MD;  Location: [facility] CATH LAB;   Service: Cardiovascular;;   Family History  Problem Relation Age of Onset   Prostate cancer Father    Breast cancer Sister     Social History   Tobacco Use   Smoking status: Never   Smokeless tobacco: Never  Substance Use Topics   Alcohol use: Yes    Comment: occasionally   Marital Status: Legally Separated  ROS  Review of Systems  Cardiovascular:  Negative for chest pain, dyspnea on exertion and leg swelling.  Respiratory:  Positive for snoring.    Objective  Blood pressure 138/87, pulse 63, temperature (!) 97.3 F (36.3 C), temperature source Temporal, resp. rate 16, height 5' 9"  (1.753 m), weight 196 lb 12.8 oz (89.3 kg), SpO2 97 %. Body mass index is 29.06 kg/m.     12/15/2021   10:07 AM 11/03/2021    9:56 AM 06/23/2019   10:10 AM  Vitals with BMI  Height 5' 9"  5' 9"  5' 9"   Weight 196 lbs 13 oz 193 lbs 3 oz 192 lbs  BMI 29.05 93.23 55.73  Systolic 220 254 270  Diastolic 87 69 80  Pulse 63 78 76    Physical Exam Neck:     Vascular: No carotid bruit or JVD.  Cardiovascular:     Rate and Rhythm: Normal rate and regular rhythm.     Pulses: Intact distal pulses.     Heart sounds: Normal heart sounds. No murmur heard.    No gallop.  Pulmonary:     Effort: Pulmonary effort is normal.  Breath sounds: Normal breath sounds.  Abdominal:     General: Bowel sounds are normal.     Palpations: Abdomen is soft.  Musculoskeletal:     Right lower leg: No edema.     Left lower leg: No edema.     Medications and allergies  No Known Allergies   Medication list after today's encounter   Current Outpatient Medications:    aspirin 325 MG tablet, Take 325 mg by mouth daily., Disp: , Rfl:    carvedilol (COREG) 6.25 MG tablet, Take 1 tablet (6.25 mg total) by mouth 2 (two) times daily with a meal., Disp: 180 tablet, Rfl: 3   Evolocumab (REPATHA SURECLICK) 453 MG/ML SOAJ, ADMINISTER 1 ML UNDER THE SKIN INTO SKIN EVERY 14 DAYS, Disp: 6 mL, Rfl: 3   Multiple Vitamin  (MULTIVITAMIN) tablet, Take 1 tablet by mouth daily., Disp: , Rfl:    nitroGLYCERIN (NITROSTAT) 0.4 MG SL tablet, Place 1 tablet (0.4 mg total) under the tongue every 5 (five) minutes x 3 doses as needed for chest pain., Disp: 30 tablet, Rfl: 2   losartan (COZAAR) 50 MG tablet, Take 1 tablet (50 mg total) by mouth every evening., Disp: 90 tablet, Rfl: 3  Laboratory examination:   External labs:   Labs 12/12/2021:  Sodium 139, potassium 4.0, BUN 17, creatinine 1.18, EGFR 71 mL.  Urine analysis normal.  Hb 14.1/HCT 41.3, platelets 233.  TSH 1.20.  Total cholesterol 117, triglycerides more than 4, HDL 49, LDL 47.  Chronic   Radiology:    Cardiac Studies:   PTCA and stenting 03/09/12:  Ramus intermediate branch with 3.0 x 20 mm Promus drug-eluting stent to the ramus intermediate vessel.  Residual 70-80% distal RCA stenosis. Repeat heart cath 04/02/12: RCA stenosis not significant by FFR and appeared 50%.  EKG:   EKG 11/03/2021: Normal sinus rhythm at rate of 67 bpm, incomplete right bundle branch block otherwise normal EKG. no change from 06/26/2019.   Assessment     ICD-10-CM   1. Coronary artery disease involving native coronary artery of native heart without angina pectoris  I25.10     2. Pure hypercholesterolemia  E78.00     3. Primary hypertension  I10 losartan (COZAAR) 50 MG tablet       No orders of the defined types were placed in this encounter.   Meds ordered this encounter  Medications   losartan (COZAAR) 50 MG tablet    Sig: Take 1 tablet (50 mg total) by mouth every evening.    Dispense:  90 tablet    Refill:  3    Medications Discontinued During This Encounter  Medication Reason   REPATHA SURECLICK 646 MG/ML SOAJ    losartan (COZAAR) 50 MG tablet Reorder     Recommendations:   Justin Keller is a 51 y.o.  Caucasian male with hyperlipidemia h/o NSTEMI on  03/09/2012 with NSTEMI, totally occluded ramus intermediate coronary artery.  He underwent  successful angioplasty of the same and during cardiac catheterization was found to have residual distal RCA 70-80% stenosis, FFR negative and now treated medically.  I seen him 6 weeks ago, presents for follow-up of hypertension and CAD.  Blood pressure is significantly improved, he is now taking carvedilol.  She is only been taking 25 mg of losartan, as the pharmacy did not have medications.  I sent her to a new pharmacy at 50 mg in the evening which will further improve this blood pressure.  He is presently taking steroids for a  Stainback leading to mild elevation in blood pressure.  Reviewed his external labs, lipids and excellent control as well.  Continue same medications, I will see him back in a year or sooner if problems.  Although he has not had any cardiac work-up for most 10 years, he remains asymptomatic, routinely plays 5 sets of tennis without any chest pain or dyspnea.  With regard to sleep disordered breathing, he is being referred by Dr. Ashby Dawes for further evaluation.  I will see him in a year or sooner if problems.  External labs reviewed, renal function normal, excellent control of lipids.      Adrian Prows, MD, [facility] 12/15/2021, 11:12 AM Office: [phone]

## 2021-12-24 ENCOUNTER — Other Ambulatory Visit: Payer: Self-pay | Admitting: Cardiology

## 2022-05-11 DIAGNOSIS — G4733 Obstructive sleep apnea (adult) (pediatric): Secondary | ICD-10-CM | POA: Diagnosis not present

## 2022-05-18 DIAGNOSIS — E782 Mixed hyperlipidemia: Secondary | ICD-10-CM | POA: Diagnosis not present

## 2022-05-18 DIAGNOSIS — R5383 Other fatigue: Secondary | ICD-10-CM | POA: Diagnosis not present

## 2022-05-18 DIAGNOSIS — I2510 Atherosclerotic heart disease of native coronary artery without angina pectoris: Secondary | ICD-10-CM | POA: Diagnosis not present

## 2022-05-18 DIAGNOSIS — Z0000 Encounter for general adult medical examination without abnormal findings: Secondary | ICD-10-CM | POA: Diagnosis not present

## 2022-05-18 DIAGNOSIS — L301 Dyshidrosis [pompholyx]: Secondary | ICD-10-CM | POA: Diagnosis not present

## 2022-05-18 DIAGNOSIS — R21 Rash and other nonspecific skin eruption: Secondary | ICD-10-CM | POA: Diagnosis not present

## 2022-05-18 DIAGNOSIS — Z113 Encounter for screening for infections with a predominantly sexual mode of transmission: Secondary | ICD-10-CM | POA: Diagnosis not present

## 2022-05-18 DIAGNOSIS — Z118 Encounter for screening for other infectious and parasitic diseases: Secondary | ICD-10-CM | POA: Diagnosis not present

## 2022-06-09 DIAGNOSIS — G4733 Obstructive sleep apnea (adult) (pediatric): Secondary | ICD-10-CM | POA: Diagnosis not present

## 2022-07-10 DIAGNOSIS — G4733 Obstructive sleep apnea (adult) (pediatric): Secondary | ICD-10-CM | POA: Diagnosis not present

## 2022-07-28 DIAGNOSIS — F411 Generalized anxiety disorder: Secondary | ICD-10-CM | POA: Diagnosis not present

## 2022-07-28 DIAGNOSIS — R051 Acute cough: Secondary | ICD-10-CM | POA: Diagnosis not present

## 2022-07-28 DIAGNOSIS — R062 Wheezing: Secondary | ICD-10-CM | POA: Diagnosis not present

## 2022-08-30 DIAGNOSIS — F411 Generalized anxiety disorder: Secondary | ICD-10-CM | POA: Diagnosis not present

## 2022-12-11 ENCOUNTER — Other Ambulatory Visit: Payer: Self-pay | Admitting: Cardiology

## 2022-12-11 DIAGNOSIS — E7800 Pure hypercholesterolemia, unspecified: Secondary | ICD-10-CM

## 2022-12-25 ENCOUNTER — Telehealth: Payer: Self-pay | Admitting: Cardiology

## 2022-12-25 DIAGNOSIS — E7800 Pure hypercholesterolemia, unspecified: Secondary | ICD-10-CM

## 2022-12-25 MED ORDER — REPATHA SURECLICK 140 MG/ML ~~LOC~~ SOAJ: SUBCUTANEOUS | 3 refills | Status: AC

## 2022-12-25 NOTE — Telephone Encounter (Signed)
 *  STAT* If patient is at the pharmacy, call can be transferred to refill team.   1. Which medications need to be refilled? (please list name of each medication and dose if known)   REPATHA SURECLICK 140 MG/ML SOAJ   2. Would you like to learn more about the convenience, safety, & potential cost savings by using the [facility]?    3. Are you open to using the [facility] (Type [facility]).   4. Which pharmacy/location (including street and city if local pharmacy) is medication to be sent to?  [id number] - [place], [place] - [address]    5. Do they need a 30 day or 90 day supply?  90

## 2022-12-25 NOTE — Telephone Encounter (Signed)
Refill sent in

## 2022-12-26 ENCOUNTER — Other Ambulatory Visit (HOSPITAL_COMMUNITY): Payer: Self-pay

## 2022-12-26 ENCOUNTER — Telehealth: Payer: Self-pay | Admitting: Cardiology

## 2022-12-26 ENCOUNTER — Telehealth: Payer: Self-pay | Admitting: Pharmacy Technician

## 2022-12-26 DIAGNOSIS — E7800 Pure hypercholesterolemia, unspecified: Secondary | ICD-10-CM

## 2022-12-26 NOTE — Telephone Encounter (Signed)
Pharmacy Patient Advocate Encounter   Received notification from Pt Calls Messages that prior authorization for repatha is required/requested.   Insurance verification completed.   The patient is insured through [facility] .   Per test claim: PA required; [place] submitted to [employer] via CoverMyMeds Key/confirmation #/EOC [id number] Status is pending

## 2022-12-26 NOTE — Telephone Encounter (Signed)
Pharmacy Patient Advocate Encounter  Received notification from [facility] that Prior Authorization for repatha has been APPROVED from 12/26/22 to 12/26/23. Ran test claim, Copay is $105.00. This test claim was processed through [facility]- copay amounts may vary at other pharmacies due to pharmacy/plan contracts, or as the patient moves through the different stages of their insurance plan.   PA #/Case ID/Reference #: [id number]

## 2022-12-26 NOTE — Telephone Encounter (Signed)
  Per mychart scheduling message:  Patient states that his insurance won't cover his Repatha, which he is completely out of, without a prior auth. Please advise patient.

## 2022-12-26 NOTE — Telephone Encounter (Signed)
Left detailed message for pt that PA has been approved. Refill was sent tin yesterday.

## 2022-12-26 NOTE — Telephone Encounter (Signed)
Pharmacy Patient Advocate Encounter   Received notification from Pt Calls Messages that prior authorization for repatha  is required/requested.   Insurance verification completed.   The patient is insured through [facility] .   Per test claim: PA required; PA started via CoverMyMeds. KEY [id number] . Waiting for clinical questions to populate.

## 2023-03-07 ENCOUNTER — Ambulatory Visit: Payer: Self-pay | Admitting: Cardiology

## 2023-03-22 DIAGNOSIS — R059 Cough, unspecified: Secondary | ICD-10-CM | POA: Diagnosis not present

## 2023-04-13 ENCOUNTER — Encounter: Payer: Self-pay | Admitting: Cardiology

## 2023-06-06 ENCOUNTER — Ambulatory Visit: Payer: Self-pay | Admitting: Cardiology

## 2023-07-05 ENCOUNTER — Telehealth: Payer: Self-pay | Admitting: Cardiology

## 2023-07-05 DIAGNOSIS — Z0000 Encounter for general adult medical examination without abnormal findings: Secondary | ICD-10-CM

## 2023-07-05 DIAGNOSIS — Z131 Encounter for screening for diabetes mellitus: Secondary | ICD-10-CM

## 2023-07-05 DIAGNOSIS — I10 Essential (primary) hypertension: Secondary | ICD-10-CM

## 2023-07-05 DIAGNOSIS — E7800 Pure hypercholesterolemia, unspecified: Secondary | ICD-10-CM

## 2023-07-05 NOTE — Telephone Encounter (Signed)
 If he has not had labs done by PCP, then lipids, A1c and CMP

## 2023-07-05 NOTE — Telephone Encounter (Signed)
  Per mychart scheduling message:  Patient is asking if he will need to get lab work done before he comes for his visit on 08/24/23. Please advise

## 2023-07-05 NOTE — Telephone Encounter (Signed)
 Left message to call office

## 2023-07-09 NOTE — Telephone Encounter (Signed)
 Left message to call office

## 2023-07-13 ENCOUNTER — Ambulatory Visit: Payer: Self-pay | Admitting: Cardiology

## 2023-07-26 DIAGNOSIS — Z131 Encounter for screening for diabetes mellitus: Secondary | ICD-10-CM | POA: Diagnosis not present

## 2023-07-26 DIAGNOSIS — E7800 Pure hypercholesterolemia, unspecified: Secondary | ICD-10-CM | POA: Diagnosis not present

## 2023-07-26 DIAGNOSIS — I2510 Atherosclerotic heart disease of native coronary artery without angina pectoris: Secondary | ICD-10-CM | POA: Diagnosis not present

## 2023-07-26 DIAGNOSIS — Z125 Encounter for screening for malignant neoplasm of prostate: Secondary | ICD-10-CM | POA: Diagnosis not present

## 2023-07-26 DIAGNOSIS — E782 Mixed hyperlipidemia: Secondary | ICD-10-CM | POA: Diagnosis not present

## 2023-07-26 LAB — LIPID PANEL

## 2023-07-27 LAB — COMPREHENSIVE METABOLIC PANEL WITH GFR
ALT: 38 IU/L (ref 0–44)
AST: 32 IU/L (ref 0–40)
Albumin: 4.4 g/dL (ref 3.8–4.9)
Alkaline Phosphatase: 62 IU/L (ref 44–121)
BUN/Creatinine Ratio: 11 (ref 9–20)
BUN: 11 mg/dL (ref 6–24)
Bilirubin Total: 0.5 mg/dL (ref 0.0–1.2)
CO2: 23 mmol/L (ref 20–29)
Calcium: 9.8 mg/dL (ref 8.7–10.2)
Chloride: 103 mmol/L (ref 96–106)
Creatinine, Ser: 0.99 mg/dL (ref 0.76–1.27)
Globulin, Total: 2.9 g/dL (ref 1.5–4.5)
Glucose: 92 mg/dL (ref 70–99)
Potassium: 4.3 mmol/L (ref 3.5–5.2)
Sodium: 143 mmol/L (ref 134–144)
Total Protein: 7.3 g/dL (ref 6.0–8.5)
eGFR: 91 mL/min/{1.73_m2} (ref >59)

## 2023-07-27 LAB — LIPID PANEL
Cholesterol, Total: 140 mg/dL (ref 100–199)
HDL: 51 mg/dL (ref >39)
LDL CALC COMMENT:: 2.7 ratio (ref 0.0–5.0)
LDL Chol Calc (NIH): 63 mg/dL (ref 0–99)
Triglycerides: 150 mg/dL — ABNORMAL HIGH (ref 0–149)
VLDL Cholesterol Cal: 26 mg/dL (ref 5–40)

## 2023-07-27 LAB — HEMOGLOBIN A1C
Est. average glucose Bld gHb Est-mCnc: 94 mg/dL
Hgb A1c MFr Bld: 4.9 % (ref 4.8–5.6)

## 2023-07-28 ENCOUNTER — Encounter: Payer: Self-pay | Admitting: Cardiology

## 2023-07-28 NOTE — Progress Notes (Signed)
 Your labs are normal, mildly elevated triglycerides, will discuss more on the office visit soon.

## 2023-08-22 DIAGNOSIS — E782 Mixed hyperlipidemia: Secondary | ICD-10-CM | POA: Diagnosis not present

## 2023-08-22 DIAGNOSIS — I2510 Atherosclerotic heart disease of native coronary artery without angina pectoris: Secondary | ICD-10-CM | POA: Diagnosis not present

## 2023-08-22 DIAGNOSIS — G4733 Obstructive sleep apnea (adult) (pediatric): Secondary | ICD-10-CM | POA: Diagnosis not present

## 2023-08-22 DIAGNOSIS — Z0000 Encounter for general adult medical examination without abnormal findings: Secondary | ICD-10-CM | POA: Diagnosis not present

## 2023-08-24 ENCOUNTER — Other Ambulatory Visit (HOSPITAL_COMMUNITY): Payer: Self-pay

## 2023-08-24 ENCOUNTER — Encounter: Payer: Self-pay | Admitting: Cardiology

## 2023-08-24 ENCOUNTER — Ambulatory Visit: Payer: Self-pay | Attending: Cardiology | Admitting: Cardiology

## 2023-08-24 VITALS — BP 124/94 | HR 85 | Ht 69.0 in | Wt 167.8 lb

## 2023-08-24 DIAGNOSIS — I2510 Atherosclerotic heart disease of native coronary artery without angina pectoris: Secondary | ICD-10-CM | POA: Diagnosis not present

## 2023-08-24 DIAGNOSIS — I10 Essential (primary) hypertension: Secondary | ICD-10-CM

## 2023-08-24 DIAGNOSIS — E7800 Pure hypercholesterolemia, unspecified: Secondary | ICD-10-CM

## 2023-08-24 MED ORDER — OLMESARTAN MEDOXOMIL 20 MG PO TABS
20.0000 mg | ORAL_TABLET | Freq: Every day | ORAL | 3 refills | Status: AC
  Filled 2023-08-24: qty 90, 90d supply, fill #0

## 2023-08-24 MED ORDER — ASPIRIN 81 MG PO CHEW: 81.0000 mg | CHEWABLE_TABLET | Freq: Every day | ORAL | Status: AC

## 2023-08-24 NOTE — Patient Instructions (Addendum)
 Medication Instructions:  Start Olmesartan 20 mg by mouth daily  *If you need a refill on your cardiac medications before your next appointment, please call your pharmacy*  Lab Work: none If you have labs (blood work) drawn today and your tests are completely normal, you will receive your results only by: MyChart Message (if you have MyChart) OR A paper copy in the mail If you have any lab test that is abnormal or we need to change your treatment, we will call you to review the results.  Testing/Procedures: none  Follow-Up: At [facility], you and your health needs are our priority.  As part of our continuing mission to provide you with exceptional heart care, our providers are all part of one team.  This team includes your primary Cardiologist (physician) and Advanced Practice Providers or APPs (Physician Assistants and Nurse Practitioners) who all work together to provide you with the care you need, when you need it.  Your next appointment:   As needed  Provider:   Knox Perl, MD    We recommend signing up for the patient portal called "MyChart".  Sign up information is provided on this After Visit Summary.  MyChart is used to connect with patients for Virtual Visits (Telemedicine).  Patients are able to view lab/test results, encounter notes, upcoming appointments, etc.  Non-urgent messages can be sent to your provider as well.   To learn more about what you can do with MyChart, go to [email or web address].   Other Instructions

## 2023-08-24 NOTE — Progress Notes (Unsigned)
 Cardiology Office Note:  .   Date:  08/24/2023  ID:  Justin Keller, DOB 1970/06/01, MRN [id number] PCP: Virgle Grime, MD   [facility] Providers Cardiologist:  Knox Perl, MD { Click to update primary MD,subspecialty MD or APP then REFRESH:1}  History of Present Illness: .   Justin Keller is a 53 y.o. Caucasian male with hyperlipidemia h/o NSTEMI on  03/09/2012 with NSTEMI, totally occluded ramus intermediate coronary artery.  He underwent successful angioplasty of the same and during cardiac catheterization was found to have residual distal RCA 70-80% stenosis, FFR negative and now treated medically.    Discussed the use of AI scribe software for clinical note transcription with the patient, who gave verbal consent to proceed.  History of Present Illness    Labs   Lab Results  Component Value Date   CHOL 140 07/26/2023   HDL 51 07/26/2023   LDLCALC 63 07/26/2023   TRIG 150 (H) 07/26/2023   CHOLHDL 2.7 07/26/2023   Lab Results  Component Value Date   NA 143 07/26/2023   K 4.3 07/26/2023   CO2 23 07/26/2023   GLUCOSE 92 07/26/2023   BUN 11 07/26/2023   CREATININE 0.99 07/26/2023   CALCIUM  9.8 07/26/2023   EGFR 91 07/26/2023   GFRNONAA >90 02/17/2013      Latest Ref Rng & Units 07/26/2023    8:27 AM 02/17/2013    1:49 PM 02/17/2013   12:18 PM  BMP  Glucose 70 - 99 mg/dL 92  94  90   BUN 6 - 24 mg/dL 11  20  16    Creatinine 0.76 - 1.27 mg/dL 2.54  2.70  6.23   BUN/Creat Ratio 9 - 20 11     Sodium 134 - 144 mmol/L 143  139  133   Potassium 3.5 - 5.2 mmol/L 4.3  4.1  5.8   Chloride 96 - 106 mmol/L 103  103  100   CO2 20 - 29 mmol/L 23   19   Calcium  8.7 - 10.2 mg/dL 9.8   9.2     Lab Results  Component Value Date   HGBA1C 4.9 07/26/2023    External Labs:  PCP faxed labs 07/26/2023:  Serum glucose 92 mg, BUN 11, creatinine 0.94, EGFR 97 mL, potassium 4.2, LFTs normal.  Hb 16.2/HCT 48.5, platelets 227.  Total cholesterol 146,  triglycerides 151, HDL 52, LDL 68.  TSH normal at 1.72.  A1c 4.9%.  ROS  Review of Systems  Cardiovascular:  Negative for chest pain, dyspnea on exertion and leg swelling.   Physical Exam:   VS:  BP (!) 124/94   Pulse 85   Ht 5\' 9"  (1.753 m)   Wt 167 lb 12.8 oz (76.1 kg)   SpO2 95%   BMI 24.78 kg/m    Wt Readings from Last 3 Encounters:  08/24/23 167 lb 12.8 oz (76.1 kg)  12/15/21 196 lb 12.8 oz (89.3 kg)  11/03/21 193 lb 3.2 oz (87.6 kg)    Physical Exam Neck:     Vascular: No carotid bruit or JVD.  Cardiovascular:     Rate and Rhythm: Normal rate and regular rhythm.     Pulses: Intact distal pulses.     Heart sounds: Normal heart sounds. No murmur heard.    No gallop.  Pulmonary:     Effort: Pulmonary effort is normal.     Breath sounds: Normal breath sounds.  Abdominal:     General: Bowel sounds are normal.  Palpations: Abdomen is soft.  Musculoskeletal:     Right lower leg: No edema.     Left lower leg: No edema.    Studies Reviewed: Aaron Aas     EKG:    EKG Interpretation Date/Time:  Friday Aug 24 2023 10:08:39 EDT Ventricular Rate:  85 PR Interval:  134 QRS Duration:  94 QT Interval:  372 QTC Calculation: 442 R Axis:   89  Text Interpretation: EKG 08/24/2023: Normal sinus rhythm at rate of 85 bpm, normal EKG.  No change from 02/17/2013. Confirmed by Genesis Paget, Jagadeesh ([id number]) on 08/24/2023 10:20:09 AM    Medications and allergies    No Known Allergies   Current Outpatient Medications:    aspirin  (ASPIRIN  CHILDRENS) 81 MG chewable tablet, Chew 1 tablet (81 mg total) by mouth daily., Disp: , Rfl:    Evolocumab  (REPATHA  SURECLICK) 140 MG/ML SOAJ, ADMINISTER 1 ML UNDER THE SKIN INTO SKIN EVERY 14 DAYS (Patient taking differently: ADMINISTER 1 ML UNDER THE SKIN INTO SKIN EVERY 14 DAYS. Per patient taking once every 3 weeks.), Disp: 6 mL, Rfl: 3   Multiple Vitamin (MULTIVITAMIN) tablet, Take 1 tablet by mouth daily., Disp: , Rfl:    olmesartan (BENICAR) 20 MG  tablet, Take 1 tablet (20 mg total) by mouth daily., Disp: 90 tablet, Rfl: 3   Meds ordered this encounter  Medications   aspirin  (ASPIRIN  CHILDRENS) 81 MG chewable tablet    Sig: Chew 1 tablet (81 mg total) by mouth daily.   olmesartan (BENICAR) 20 MG tablet    Sig: Take 1 tablet (20 mg total) by mouth daily.    Dispense:  90 tablet    Refill:  3    Dr. Virgle Grime to do refills     Medications Discontinued During This Encounter  Medication Reason   aspirin  325 MG tablet Discontinued by provider   carvedilol  (COREG ) 6.25 MG tablet Discontinued by provider   losartan  (COZAAR ) 50 MG tablet Discontinued by provider   nitroGLYCERIN  (NITROSTAT ) 0.4 MG SL tablet Discontinued by provider     ASSESSMENT AND PLAN: .      ICD-10-CM   1. Coronary artery disease involving native coronary artery of native heart without angina pectoris  I25.10 EKG 12-Lead    aspirin  (ASPIRIN  CHILDRENS) 81 MG chewable tablet    2. Primary hypertension  I10 EKG 12-Lead    olmesartan (BENICAR) 20 MG tablet    3. Hypercholesteremia  E78.00 EKG 12-Lead      Assessment and Plan Assessment & Plan      Signed,  Knox Perl, MD, [facility] 08/24/2023, 10:31 AM [facility] [address] [place], [place] [address] Phone: [phone]. Fax:  [phone]

## 2023-09-07 DIAGNOSIS — Z1211 Encounter for screening for malignant neoplasm of colon: Secondary | ICD-10-CM | POA: Diagnosis not present

## 2023-09-07 DIAGNOSIS — Z23 Encounter for immunization: Secondary | ICD-10-CM | POA: Diagnosis not present

## 2023-09-07 DIAGNOSIS — Z1212 Encounter for screening for malignant neoplasm of rectum: Secondary | ICD-10-CM | POA: Diagnosis not present

## 2023-11-13 DIAGNOSIS — L301 Dyshidrosis [pompholyx]: Secondary | ICD-10-CM | POA: Diagnosis not present

## 2023-11-13 DIAGNOSIS — L408 Other psoriasis: Secondary | ICD-10-CM | POA: Diagnosis not present

## 2023-12-07 DIAGNOSIS — Z23 Encounter for immunization: Secondary | ICD-10-CM | POA: Diagnosis not present
